# Patient Record
Sex: Male | Born: 1965 | Race: Black or African American | Hispanic: No | Smoking: Current every day smoker
Health system: Southern US, Community
[De-identification: ages and names within clinical notes are randomized; demographics above are authoritative.]

## PROBLEM LIST (undated history)

## (undated) DIAGNOSIS — K859 Acute pancreatitis without necrosis or infection, unspecified: Secondary | ICD-10-CM

## (undated) DIAGNOSIS — E119 Type 2 diabetes mellitus without complications: Secondary | ICD-10-CM

## (undated) DIAGNOSIS — Z9359 Other cystostomy status: Secondary | ICD-10-CM

---

## 2005-12-16 ENCOUNTER — Emergency Department: Payer: Self-pay | Admitting: Emergency Medicine

## 2007-10-31 ENCOUNTER — Emergency Department: Payer: Self-pay | Admitting: Emergency Medicine

## 2007-10-31 ENCOUNTER — Other Ambulatory Visit: Payer: Self-pay

## 2007-11-09 ENCOUNTER — Emergency Department: Payer: Self-pay | Admitting: Emergency Medicine

## 2007-12-18 ENCOUNTER — Emergency Department: Payer: Self-pay | Admitting: Emergency Medicine

## 2007-12-19 ENCOUNTER — Other Ambulatory Visit: Payer: Self-pay

## 2008-01-22 ENCOUNTER — Emergency Department: Payer: Self-pay | Admitting: Emergency Medicine

## 2008-01-23 ENCOUNTER — Emergency Department: Payer: Self-pay | Admitting: Emergency Medicine

## 2009-04-14 ENCOUNTER — Emergency Department: Payer: Self-pay | Admitting: Emergency Medicine

## 2009-06-15 ENCOUNTER — Emergency Department: Payer: Self-pay | Admitting: Unknown Physician Specialty

## 2010-02-17 ENCOUNTER — Emergency Department: Payer: Self-pay | Admitting: Emergency Medicine

## 2012-02-19 ENCOUNTER — Emergency Department: Payer: Self-pay | Admitting: *Deleted

## 2012-04-29 ENCOUNTER — Emergency Department: Payer: Self-pay | Admitting: Emergency Medicine

## 2012-04-29 LAB — CBC
HCT: 41.3 % (ref 40.0–52.0)
HGB: 13.6 g/dL (ref 13.0–18.0)
MCH: 30.6 pg (ref 26.0–34.0)
Platelet: 230 10*3/uL (ref 150–440)
RBC: 4.45 10*6/uL (ref 4.40–5.90)
RDW: 13.8 % (ref 11.5–14.5)
WBC: 6.1 10*3/uL (ref 3.8–10.6)

## 2012-04-29 LAB — COMPREHENSIVE METABOLIC PANEL
Albumin: 3.5 g/dL (ref 3.4–5.0)
Alkaline Phosphatase: 161 U/L — ABNORMAL HIGH (ref 50–136)
Anion Gap: 6 — ABNORMAL LOW (ref 7–16)
BUN: 6 mg/dL — ABNORMAL LOW (ref 7–18)
Bilirubin,Total: 0.3 mg/dL (ref 0.2–1.0)
Creatinine: 0.85 mg/dL (ref 0.60–1.30)
EGFR (African American): >60
EGFR (Non-African Amer.): >60
Glucose: 331 mg/dL — ABNORMAL HIGH (ref 65–99)
SGOT(AST): 80 U/L — ABNORMAL HIGH (ref 15–37)
Sodium: 142 mmol/L (ref 136–145)

## 2012-04-29 LAB — URINALYSIS, COMPLETE
Bacteria: NONE SEEN
Ketone: NEGATIVE
Nitrite: NEGATIVE
Protein: NEGATIVE
RBC,UR: <1 /HPF (ref 0–5)
Specific Gravity: 1.005 (ref 1.003–1.030)
Squamous Epithelial: NONE SEEN

## 2012-05-14 ENCOUNTER — Emergency Department: Payer: Self-pay | Admitting: Emergency Medicine

## 2012-05-25 ENCOUNTER — Emergency Department: Payer: Self-pay | Admitting: *Deleted

## 2012-06-02 ENCOUNTER — Emergency Department: Payer: Self-pay | Admitting: Emergency Medicine

## 2012-06-12 ENCOUNTER — Emergency Department: Payer: Self-pay | Admitting: Emergency Medicine

## 2012-06-12 LAB — URINALYSIS, COMPLETE
Bilirubin,UR: NEGATIVE
Glucose,UR: >=500 mg/dL (ref 0–75)
Nitrite: POSITIVE
Ph: 6 (ref 4.5–8.0)
RBC,UR: 2 /HPF (ref 0–5)
Specific Gravity: 1.025 (ref 1.003–1.030)
Squamous Epithelial: NONE SEEN

## 2012-06-12 LAB — CBC
HGB: 14.9 g/dL (ref 13.0–18.0)
MCV: 94 fL (ref 80–100)
Platelet: 244 10*3/uL (ref 150–440)
RBC: 4.89 10*6/uL (ref 4.40–5.90)
RDW: 14.1 % (ref 11.5–14.5)

## 2012-06-12 LAB — BASIC METABOLIC PANEL
Anion Gap: 8 (ref 7–16)
BUN: 13 mg/dL (ref 7–18)
Calcium, Total: 9.6 mg/dL (ref 8.5–10.1)
Co2: 27 mmol/L (ref 21–32)
Creatinine: 0.9 mg/dL (ref 0.60–1.30)
Potassium: 3.6 mmol/L (ref 3.5–5.1)
Sodium: 131 mmol/L — ABNORMAL LOW (ref 136–145)

## 2012-08-18 ENCOUNTER — Emergency Department: Payer: Self-pay | Admitting: Emergency Medicine

## 2012-08-18 LAB — COMPREHENSIVE METABOLIC PANEL
Albumin: 3.4 g/dL (ref 3.4–5.0)
Anion Gap: 16 (ref 7–16)
BUN: 11 mg/dL (ref 7–18)
Calcium, Total: 10.3 mg/dL — ABNORMAL HIGH (ref 8.5–10.1)
Co2: 20 mmol/L — ABNORMAL LOW (ref 21–32)
EGFR (African American): >60
EGFR (Non-African Amer.): >60
Glucose: 270 mg/dL — ABNORMAL HIGH (ref 65–99)
Potassium: 4.1 mmol/L (ref 3.5–5.1)
SGOT(AST): 29 U/L (ref 15–37)
SGPT (ALT): 44 U/L (ref 12–78)
Total Protein: 8.4 g/dL — ABNORMAL HIGH (ref 6.4–8.2)

## 2012-08-18 LAB — LIPASE, BLOOD: Lipase: 45 U/L — ABNORMAL LOW (ref 73–393)

## 2012-08-18 LAB — CBC
HCT: 48.9 % (ref 40.0–52.0)
HGB: 16.4 g/dL (ref 13.0–18.0)
MCH: 31 pg (ref 26.0–34.0)
MCHC: 33.5 g/dL (ref 32.0–36.0)
WBC: 15.9 10*3/uL — ABNORMAL HIGH (ref 3.8–10.6)

## 2012-08-18 LAB — URINALYSIS, COMPLETE
Bilirubin,UR: NEGATIVE
Glucose,UR: >=500 mg/dL (ref 0–75)
Nitrite: POSITIVE
Protein: 100
Specific Gravity: 1.028 (ref 1.003–1.030)
Squamous Epithelial: NONE SEEN
WBC UR: 13 /HPF (ref 0–5)

## 2012-08-21 LAB — URINE CULTURE

## 2012-08-24 ENCOUNTER — Emergency Department: Payer: Self-pay | Admitting: *Deleted

## 2012-08-24 LAB — URINALYSIS, COMPLETE
Glucose,UR: >=500 mg/dL (ref 0–75)
Nitrite: POSITIVE
Ph: 6 (ref 4.5–8.0)
Specific Gravity: 1.026 (ref 1.003–1.030)

## 2012-08-24 LAB — CBC WITH DIFFERENTIAL/PLATELET
Basophil #: 0.1 10*3/uL (ref 0.0–0.1)
Eosinophil %: 0.8 %
HCT: 44.2 % (ref 40.0–52.0)
HGB: 14.6 g/dL (ref 13.0–18.0)
Lymphocyte #: 1.7 10*3/uL (ref 1.0–3.6)
Lymphocyte %: 15.4 %
MCV: 91 fL (ref 80–100)
Monocyte %: 7.7 %
Neutrophil #: 8.5 10*3/uL — ABNORMAL HIGH (ref 1.4–6.5)
Platelet: 363 10*3/uL (ref 150–440)
RDW: 13.8 % (ref 11.5–14.5)
WBC: 11.2 10*3/uL — ABNORMAL HIGH (ref 3.8–10.6)

## 2012-08-24 LAB — BASIC METABOLIC PANEL
Anion Gap: 12 (ref 7–16)
BUN: 8 mg/dL (ref 7–18)
Calcium, Total: 9.4 mg/dL (ref 8.5–10.1)
Chloride: 100 mmol/L (ref 98–107)
Co2: 23 mmol/L (ref 21–32)
Creatinine: 0.6 mg/dL (ref 0.60–1.30)
EGFR (African American): >60
EGFR (Non-African Amer.): >60
Glucose: 267 mg/dL — ABNORMAL HIGH (ref 65–99)
Osmolality: 278 (ref 275–301)
Potassium: 3.7 mmol/L (ref 3.5–5.1)
Sodium: 135 mmol/L — ABNORMAL LOW (ref 136–145)

## 2012-09-13 ENCOUNTER — Emergency Department: Payer: Self-pay

## 2012-09-18 ENCOUNTER — Emergency Department: Payer: Self-pay | Admitting: Emergency Medicine

## 2012-09-19 LAB — CBC
MCHC: 34 g/dL (ref 32.0–36.0)
MCV: 94 fL (ref 80–100)
Platelet: 275 10*3/uL (ref 150–440)
RBC: 4.2 10*6/uL — ABNORMAL LOW (ref 4.40–5.90)
WBC: 6 10*3/uL (ref 3.8–10.6)

## 2012-09-19 LAB — COMPREHENSIVE METABOLIC PANEL
Alkaline Phosphatase: 202 U/L — ABNORMAL HIGH (ref 50–136)
BUN: 9 mg/dL (ref 7–18)
Bilirubin,Total: 0.1 mg/dL — ABNORMAL LOW (ref 0.2–1.0)
Co2: 24 mmol/L (ref 21–32)
Creatinine: 0.92 mg/dL (ref 0.60–1.30)
EGFR (Non-African Amer.): >60
Glucose: 620 mg/dL (ref 65–99)
SGPT (ALT): 62 U/L (ref 12–78)
Total Protein: 6.9 g/dL (ref 6.4–8.2)

## 2012-10-05 ENCOUNTER — Emergency Department: Payer: Self-pay | Admitting: Emergency Medicine

## 2012-10-28 ENCOUNTER — Emergency Department: Payer: Self-pay | Admitting: Emergency Medicine

## 2012-10-28 LAB — URINALYSIS, COMPLETE
Bilirubin,UR: NEGATIVE
Glucose,UR: >=500 mg/dL (ref 0–75)
Ph: 6 (ref 4.5–8.0)
Protein: 30
RBC,UR: 32 /HPF (ref 0–5)
Squamous Epithelial: 1

## 2012-10-30 LAB — URINE CULTURE

## 2012-11-01 LAB — URINALYSIS, COMPLETE
Bilirubin,UR: NEGATIVE
Glucose,UR: >=500 mg/dL (ref 0–75)
Leukocyte Esterase: NEGATIVE
Protein: 30
Specific Gravity: 1.021 (ref 1.003–1.030)
Squamous Epithelial: NONE SEEN

## 2012-11-02 ENCOUNTER — Inpatient Hospital Stay: Payer: Self-pay | Admitting: Internal Medicine

## 2012-11-02 LAB — COMPREHENSIVE METABOLIC PANEL
Anion Gap: 23 — ABNORMAL HIGH (ref 7–16)
BUN: 21 mg/dL — ABNORMAL HIGH (ref 7–18)
Calcium, Total: 9.9 mg/dL (ref 8.5–10.1)
Chloride: 96 mmol/L — ABNORMAL LOW (ref 98–107)
EGFR (African American): >60
Glucose: 315 mg/dL — ABNORMAL HIGH (ref 65–99)
Osmolality: 272 (ref 275–301)
Potassium: 4.8 mmol/L (ref 3.5–5.1)
SGOT(AST): 31 U/L (ref 15–37)
SGPT (ALT): 66 U/L (ref 12–78)

## 2012-11-02 LAB — CBC WITH DIFFERENTIAL/PLATELET
Basophil %: 0.8 %
Eosinophil %: 0 %
HGB: 13 g/dL (ref 13.0–18.0)
Lymphocyte #: 1.3 10*3/uL (ref 1.0–3.6)
MCH: 30.6 pg (ref 26.0–34.0)
MCV: 92 fL (ref 80–100)
Monocyte #: 1.4 x10 3/mm — ABNORMAL HIGH (ref 0.2–1.0)
Monocyte %: 4.4 %
Platelet: 271 10*3/uL (ref 150–440)
RBC: 4.23 10*6/uL — ABNORMAL LOW (ref 4.40–5.90)
RDW: 14.5 % (ref 11.5–14.5)
WBC: 32.5 10*3/uL — ABNORMAL HIGH (ref 3.8–10.6)

## 2012-11-02 LAB — BASIC METABOLIC PANEL
Anion Gap: 16 (ref 7–16)
Anion Gap: 13 (ref 7–16)
BUN: 11 mg/dL (ref 7–18)
BUN: 10 mg/dL (ref 7–18)
BUN: 10 mg/dL (ref 7–18)
Calcium, Total: 9 mg/dL (ref 8.5–10.1)
Chloride: 102 mmol/L (ref 98–107)
Chloride: 103 mmol/L (ref 98–107)
Chloride: 104 mmol/L (ref 98–107)
Chloride: 102 mmol/L (ref 98–107)
Co2: 11 mmol/L — ABNORMAL LOW (ref 21–32)
Co2: 12 mmol/L — ABNORMAL LOW (ref 21–32)
Creatinine: 0.62 mg/dL (ref 0.60–1.30)
Creatinine: 0.7 mg/dL (ref 0.60–1.30)
Creatinine: 0.77 mg/dL (ref 0.60–1.30)
Creatinine: 0.74 mg/dL (ref 0.60–1.30)
EGFR (African American): >60
EGFR (Non-African Amer.): >60
Glucose: 225 mg/dL — ABNORMAL HIGH (ref 65–99)
Glucose: 317 mg/dL — ABNORMAL HIGH (ref 65–99)
Osmolality: 267 (ref 275–301)
Osmolality: 266 (ref 275–301)
Osmolality: 276 (ref 275–301)
Osmolality: 274 (ref 275–301)
Potassium: 4.2 mmol/L (ref 3.5–5.1)
Potassium: 4.3 mmol/L (ref 3.5–5.1)
Potassium: 4.5 mmol/L (ref 3.5–5.1)
Sodium: 132 mmol/L — ABNORMAL LOW (ref 136–145)
Sodium: 131 mmol/L — ABNORMAL LOW (ref 136–145)

## 2012-11-02 LAB — HEPATIC FUNCTION PANEL A (ARMC)
Albumin: 3.1 g/dL — ABNORMAL LOW (ref 3.4–5.0)
Alkaline Phosphatase: 292 U/L — ABNORMAL HIGH (ref 50–136)
Bilirubin, Direct: 0.1 mg/dL (ref 0.00–0.20)
Bilirubin,Total: 0.6 mg/dL (ref 0.2–1.0)
SGPT (ALT): 56 U/L (ref 12–78)

## 2012-11-02 LAB — CREATININE, URINE, RANDOM: Creatinine, Urine Random: 19.2 mg/dL — ABNORMAL LOW (ref 30.0–125.0)

## 2012-11-02 LAB — CK TOTAL AND CKMB (NOT AT ARMC): CK-MB: 3.1 ng/mL (ref 0.5–3.6)

## 2012-11-02 LAB — CBC
HCT: 49 % (ref 40.0–52.0)
HGB: 15.6 g/dL (ref 13.0–18.0)
MCH: 29.8 pg (ref 26.0–34.0)
MCHC: 31.8 g/dL — ABNORMAL LOW (ref 32.0–36.0)
MCV: 94 fL (ref 80–100)
Platelet: 337 10*3/uL (ref 150–440)
RDW: 14.2 % (ref 11.5–14.5)
WBC: 34.1 10*3/uL — ABNORMAL HIGH (ref 3.8–10.6)

## 2012-11-02 LAB — OSMOLALITY, URINE: Osmolality: 582 mOsm/kg

## 2012-11-03 LAB — COMPREHENSIVE METABOLIC PANEL
Albumin: 2.7 g/dL — ABNORMAL LOW (ref 3.4–5.0)
Alkaline Phosphatase: 261 U/L — ABNORMAL HIGH (ref 50–136)
Anion Gap: 12 (ref 7–16)
BUN: 8 mg/dL (ref 7–18)
Bilirubin,Total: 0.3 mg/dL (ref 0.2–1.0)
Calcium, Total: 8.9 mg/dL (ref 8.5–10.1)
Creatinine: 0.74 mg/dL (ref 0.60–1.30)
EGFR (African American): >60
EGFR (Non-African Amer.): >60
Glucose: 108 mg/dL — ABNORMAL HIGH (ref 65–99)
Osmolality: 273 (ref 275–301)
Potassium: 3.7 mmol/L (ref 3.5–5.1)
SGOT(AST): 24 U/L (ref 15–37)
Total Protein: 7.6 g/dL (ref 6.4–8.2)

## 2012-11-03 LAB — BASIC METABOLIC PANEL
BUN: 9 mg/dL (ref 7–18)
Calcium, Total: 9 mg/dL (ref 8.5–10.1)
Chloride: 102 mmol/L (ref 98–107)
EGFR (African American): >60
EGFR (Non-African Amer.): >60
Glucose: 236 mg/dL — ABNORMAL HIGH (ref 65–99)
Osmolality: 267 (ref 275–301)
Potassium: 4.2 mmol/L (ref 3.5–5.1)
Sodium: 130 mmol/L — ABNORMAL LOW (ref 136–145)

## 2012-11-03 LAB — CBC WITH DIFFERENTIAL/PLATELET
Basophil %: 0.7 %
Eosinophil #: 0 10*3/uL (ref 0.0–0.7)
Eosinophil %: 0 %
HCT: 39.8 % — ABNORMAL LOW (ref 40.0–52.0)
HGB: 13.1 g/dL (ref 13.0–18.0)
Lymphocyte #: 1.1 10*3/uL (ref 1.0–3.6)
MCH: 30 pg (ref 26.0–34.0)
MCHC: 33 g/dL (ref 32.0–36.0)
MCV: 91 fL (ref 80–100)
Monocyte #: 1.3 x10 3/mm — ABNORMAL HIGH (ref 0.2–1.0)
Neutrophil #: 24.1 10*3/uL — ABNORMAL HIGH (ref 1.4–6.5)
Neutrophil %: 90.3 %
WBC: 26.7 10*3/uL — ABNORMAL HIGH (ref 3.8–10.6)

## 2012-11-04 LAB — CBC WITH DIFFERENTIAL/PLATELET
Basophil #: 0.1 10*3/uL (ref 0.0–0.1)
Eosinophil #: 0 10*3/uL (ref 0.0–0.7)
Eosinophil %: 0.2 %
HCT: 37.9 % — ABNORMAL LOW (ref 40.0–52.0)
Lymphocyte %: 7.6 %
MCHC: 32.4 g/dL (ref 32.0–36.0)
Monocyte %: 7.3 %
Neutrophil #: 14.2 10*3/uL — ABNORMAL HIGH (ref 1.4–6.5)
Neutrophil %: 84.4 %
Platelet: 266 10*3/uL (ref 150–440)
RBC: 4.21 10*6/uL — ABNORMAL LOW (ref 4.40–5.90)
RDW: 14.6 % — ABNORMAL HIGH (ref 11.5–14.5)
WBC: 16.8 10*3/uL — ABNORMAL HIGH (ref 3.8–10.6)

## 2012-11-04 LAB — BASIC METABOLIC PANEL
Calcium, Total: 8.7 mg/dL (ref 8.5–10.1)
Chloride: 101 mmol/L (ref 98–107)
Co2: 24 mmol/L (ref 21–32)
Creatinine: 0.44 mg/dL — ABNORMAL LOW (ref 0.60–1.30)
EGFR (Non-African Amer.): >60
Potassium: 4.4 mmol/L (ref 3.5–5.1)
Sodium: 134 mmol/L — ABNORMAL LOW (ref 136–145)

## 2012-11-06 LAB — CULTURE, BLOOD (SINGLE)

## 2012-11-07 LAB — CULTURE, BLOOD (SINGLE)

## 2012-11-28 ENCOUNTER — Emergency Department: Payer: Self-pay | Admitting: Emergency Medicine

## 2012-12-17 ENCOUNTER — Emergency Department: Payer: Self-pay | Admitting: Emergency Medicine

## 2012-12-17 LAB — CBC WITH DIFFERENTIAL/PLATELET
Basophil %: 0.5 %
Eosinophil #: 0.2 10*3/uL (ref 0.0–0.7)
HCT: 40.9 % (ref 40.0–52.0)
HGB: 13.4 g/dL (ref 13.0–18.0)
Lymphocyte %: 15.9 %
Monocyte #: 0.5 x10 3/mm (ref 0.2–1.0)
Monocyte %: 5.2 %
Neutrophil %: 76.8 %
Platelet: 303 10*3/uL (ref 150–440)
RBC: 4.54 10*6/uL (ref 4.40–5.90)
WBC: 10.4 10*3/uL (ref 3.8–10.6)

## 2012-12-17 LAB — URINALYSIS, COMPLETE
Bilirubin,UR: NEGATIVE
Glucose,UR: >=500 mg/dL (ref 0–75)
Nitrite: POSITIVE
Ph: 7 (ref 4.5–8.0)
Specific Gravity: 1.031 (ref 1.003–1.030)
Squamous Epithelial: NONE SEEN
WBC UR: 320 /HPF (ref 0–5)

## 2012-12-17 LAB — COMPREHENSIVE METABOLIC PANEL
Anion Gap: 7 (ref 7–16)
BUN: 8 mg/dL (ref 7–18)
Bilirubin,Total: 0.3 mg/dL (ref 0.2–1.0)
Chloride: 100 mmol/L (ref 98–107)
Creatinine: 0.55 mg/dL — ABNORMAL LOW (ref 0.60–1.30)
EGFR (African American): >60
Glucose: 326 mg/dL — ABNORMAL HIGH (ref 65–99)
Osmolality: 279 (ref 275–301)
Potassium: 3.9 mmol/L (ref 3.5–5.1)
SGPT (ALT): 25 U/L (ref 12–78)
Sodium: 134 mmol/L — ABNORMAL LOW (ref 136–145)
Total Protein: 8 g/dL (ref 6.4–8.2)

## 2012-12-17 LAB — SEDIMENTATION RATE: Erythrocyte Sed Rate: 54 mm/hr — ABNORMAL HIGH (ref 0–15)

## 2012-12-30 ENCOUNTER — Ambulatory Visit: Payer: Self-pay | Admitting: Urology

## 2012-12-30 ENCOUNTER — Inpatient Hospital Stay: Payer: Self-pay | Admitting: Internal Medicine

## 2012-12-30 LAB — COMPREHENSIVE METABOLIC PANEL
Calcium, Total: 9.3 mg/dL (ref 8.5–10.1)
Co2: 27 mmol/L (ref 21–32)
Creatinine: 0.62 mg/dL (ref 0.60–1.30)
Glucose: 381 mg/dL — ABNORMAL HIGH (ref 65–99)
Osmolality: 282 (ref 275–301)
Potassium: 3.3 mmol/L — ABNORMAL LOW (ref 3.5–5.1)
SGPT (ALT): 37 U/L (ref 12–78)
Sodium: 133 mmol/L — ABNORMAL LOW (ref 136–145)

## 2012-12-30 LAB — CBC WITH DIFFERENTIAL/PLATELET
Eosinophil #: 0.1 10*3/uL (ref 0.0–0.7)
Eosinophil %: 0.6 %
HCT: 39.9 % — ABNORMAL LOW (ref 40.0–52.0)
HGB: 13.1 g/dL (ref 13.0–18.0)
MCV: 90 fL (ref 80–100)
Monocyte %: 5.5 %
Neutrophil #: 12.4 10*3/uL — ABNORMAL HIGH (ref 1.4–6.5)
Neutrophil %: 81.9 %

## 2012-12-30 LAB — URINALYSIS, COMPLETE
Bacteria: NONE SEEN
Bilirubin,UR: NEGATIVE
Nitrite: NEGATIVE
Transitional Epi: <1
WBC UR: 171 /HPF (ref 0–5)

## 2012-12-31 LAB — CBC WITH DIFFERENTIAL/PLATELET
Basophil #: 0.1 10*3/uL (ref 0.0–0.1)
Eosinophil #: 0.1 10*3/uL (ref 0.0–0.7)
HCT: 35.4 % — ABNORMAL LOW (ref 40.0–52.0)
Lymphocyte %: 8.8 %
MCHC: 32.4 g/dL (ref 32.0–36.0)
Monocyte #: 1 x10 3/mm (ref 0.2–1.0)
Neutrophil #: 13.2 10*3/uL — ABNORMAL HIGH (ref 1.4–6.5)
Neutrophil %: 83.9 %
Platelet: 317 10*3/uL (ref 150–440)
WBC: 15.7 10*3/uL — ABNORMAL HIGH (ref 3.8–10.6)

## 2012-12-31 LAB — COMPREHENSIVE METABOLIC PANEL
Albumin: 2.4 g/dL — ABNORMAL LOW (ref 3.4–5.0)
Chloride: 101 mmol/L (ref 98–107)
Co2: 29 mmol/L (ref 21–32)
Glucose: 162 mg/dL — ABNORMAL HIGH (ref 65–99)
Osmolality: 274 (ref 275–301)
Potassium: 3.7 mmol/L (ref 3.5–5.1)
SGOT(AST): 36 U/L (ref 15–37)
Sodium: 136 mmol/L (ref 136–145)
Total Protein: 7.4 g/dL (ref 6.4–8.2)

## 2012-12-31 LAB — MAGNESIUM: Magnesium: 1.9 mg/dL

## 2013-01-01 LAB — CBC WITH DIFFERENTIAL/PLATELET
Basophil #: 0.1 10*3/uL (ref 0.0–0.1)
Basophil %: 0.6 %
Eosinophil #: 0.1 10*3/uL (ref 0.0–0.7)
Eosinophil %: 1.1 %
Lymphocyte #: 1.8 10*3/uL (ref 1.0–3.6)
MCV: 90 fL (ref 80–100)
Monocyte #: 0.6 x10 3/mm (ref 0.2–1.0)
Monocyte %: 4.8 %
Neutrophil #: 9 10*3/uL — ABNORMAL HIGH (ref 1.4–6.5)
Neutrophil %: 77.6 %
Platelet: 318 10*3/uL (ref 150–440)
WBC: 11.6 10*3/uL — ABNORMAL HIGH (ref 3.8–10.6)

## 2013-01-01 LAB — BASIC METABOLIC PANEL
Calcium, Total: 8.6 mg/dL (ref 8.5–10.1)
Creatinine: 0.54 mg/dL — ABNORMAL LOW (ref 0.60–1.30)
EGFR (African American): >60
EGFR (Non-African Amer.): >60
Glucose: 99 mg/dL (ref 65–99)
Potassium: 3.2 mmol/L — ABNORMAL LOW (ref 3.5–5.1)

## 2013-01-02 LAB — CBC WITH DIFFERENTIAL/PLATELET
Basophil %: 0.9 %
Eosinophil #: 0.2 10*3/uL (ref 0.0–0.7)
Eosinophil %: 1.8 %
Lymphocyte #: 1.6 10*3/uL (ref 1.0–3.6)
Lymphocyte %: 13.2 %
MCHC: 33 g/dL (ref 32.0–36.0)
MCV: 91 fL (ref 80–100)
Monocyte #: 0.5 x10 3/mm (ref 0.2–1.0)
Monocyte %: 4.5 %
Neutrophil %: 79.6 %

## 2013-01-03 LAB — CBC WITH DIFFERENTIAL/PLATELET
Basophil %: 1.1 %
Eosinophil #: 0.2 10*3/uL (ref 0.0–0.7)
Eosinophil %: 1.9 %
Lymphocyte #: 1.9 10*3/uL (ref 1.0–3.6)
Monocyte %: 6.9 %
Platelet: 387 10*3/uL (ref 150–440)
RBC: 3.74 10*6/uL — ABNORMAL LOW (ref 4.40–5.90)

## 2013-01-04 LAB — WOUND AEROBIC CULTURE

## 2013-01-05 LAB — CULTURE, BLOOD (SINGLE)

## 2013-01-14 ENCOUNTER — Emergency Department: Payer: Self-pay | Admitting: Emergency Medicine

## 2013-01-22 ENCOUNTER — Emergency Department: Payer: Self-pay | Admitting: Emergency Medicine

## 2013-01-22 LAB — CBC WITH DIFFERENTIAL/PLATELET
Basophil %: 0.9 %
Eosinophil %: 2.2 %
HCT: 41.2 % (ref 40.0–52.0)
HGB: 13.2 g/dL (ref 13.0–18.0)
Lymphocyte %: 30.9 %
MCH: 29.4 pg (ref 26.0–34.0)
MCHC: 32 g/dL (ref 32.0–36.0)
Monocyte #: 0.4 x10 3/mm (ref 0.2–1.0)
Neutrophil #: 3.9 10*3/uL (ref 1.4–6.5)
Neutrophil %: 59.8 %
Platelet: 288 10*3/uL (ref 150–440)
RBC: 4.49 10*6/uL (ref 4.40–5.90)
WBC: 6.5 10*3/uL (ref 3.8–10.6)

## 2013-01-22 LAB — COMPREHENSIVE METABOLIC PANEL
Albumin: 3 g/dL — ABNORMAL LOW (ref 3.4–5.0)
Alkaline Phosphatase: 337 U/L — ABNORMAL HIGH (ref 50–136)
Anion Gap: 7 (ref 7–16)
Calcium, Total: 9.2 mg/dL (ref 8.5–10.1)
Chloride: 101 mmol/L (ref 98–107)
Creatinine: 0.87 mg/dL (ref 0.60–1.30)
EGFR (African American): >60
EGFR (Non-African Amer.): >60
Glucose: 638 mg/dL (ref 65–99)
Potassium: 4 mmol/L (ref 3.5–5.1)
SGOT(AST): 75 U/L — ABNORMAL HIGH (ref 15–37)
SGPT (ALT): 95 U/L — ABNORMAL HIGH (ref 12–78)
Sodium: 132 mmol/L — ABNORMAL LOW (ref 136–145)
Total Protein: 7.8 g/dL (ref 6.4–8.2)

## 2013-01-26 LAB — WOUND CULTURE

## 2013-02-04 ENCOUNTER — Emergency Department: Payer: Self-pay | Admitting: Internal Medicine

## 2013-02-04 LAB — COMPREHENSIVE METABOLIC PANEL
Albumin: 3.2 g/dL — ABNORMAL LOW (ref 3.4–5.0)
Alkaline Phosphatase: 440 U/L — ABNORMAL HIGH (ref 50–136)
Anion Gap: 12 (ref 7–16)
Bilirubin,Total: 0.2 mg/dL (ref 0.2–1.0)
Co2: 23 mmol/L (ref 21–32)
Creatinine: 0.82 mg/dL (ref 0.60–1.30)
EGFR (African American): >60
EGFR (Non-African Amer.): >60
Potassium: 3.7 mmol/L (ref 3.5–5.1)
SGPT (ALT): 141 U/L — ABNORMAL HIGH (ref 12–78)
Sodium: 131 mmol/L — ABNORMAL LOW (ref 136–145)
Total Protein: 7.8 g/dL (ref 6.4–8.2)

## 2013-02-04 LAB — URINALYSIS, COMPLETE
Bilirubin,UR: NEGATIVE
Ketone: NEGATIVE
Nitrite: POSITIVE
Ph: 7 (ref 4.5–8.0)
Protein: NEGATIVE
RBC,UR: 32 /HPF (ref 0–5)
Specific Gravity: 1.028 (ref 1.003–1.030)
Squamous Epithelial: <1
WBC UR: 81 /HPF (ref 0–5)

## 2013-02-04 LAB — CBC
HCT: 43.4 % (ref 40.0–52.0)
HGB: 13.9 g/dL (ref 13.0–18.0)
MCH: 29.7 pg (ref 26.0–34.0)
MCHC: 32 g/dL (ref 32.0–36.0)
MCV: 93 fL (ref 80–100)
Platelet: 281 10*3/uL (ref 150–440)
WBC: 8.5 10*3/uL (ref 3.8–10.6)

## 2013-02-09 LAB — CULTURE, BLOOD (SINGLE)

## 2013-02-11 LAB — URINE CULTURE

## 2013-02-17 ENCOUNTER — Emergency Department: Payer: Self-pay | Admitting: Emergency Medicine

## 2013-02-17 LAB — CBC
HCT: 48.1 % (ref 40.0–52.0)
MCH: 29.5 pg (ref 26.0–34.0)
MCHC: 32 g/dL (ref 32.0–36.0)
MCV: 92 fL (ref 80–100)
RBC: 5.21 10*6/uL (ref 4.40–5.90)
RDW: 14.5 % (ref 11.5–14.5)
WBC: 6.8 10*3/uL (ref 3.8–10.6)

## 2013-02-17 LAB — URINALYSIS, COMPLETE
Bilirubin,UR: NEGATIVE
Glucose,UR: >=500 mg/dL (ref 0–75)
Ketone: NEGATIVE
Nitrite: NEGATIVE
Ph: 6 (ref 4.5–8.0)
Protein: 30
RBC,UR: 185 /HPF (ref 0–5)
Specific Gravity: 1.028 (ref 1.003–1.030)
Squamous Epithelial: NONE SEEN
WBC UR: 333 /HPF (ref 0–5)

## 2013-02-17 LAB — COMPREHENSIVE METABOLIC PANEL
Albumin: 3.4 g/dL (ref 3.4–5.0)
Alkaline Phosphatase: 464 U/L — ABNORMAL HIGH (ref 50–136)
BUN: 7 mg/dL (ref 7–18)
Bilirubin,Total: 0.3 mg/dL (ref 0.2–1.0)
Chloride: 98 mmol/L (ref 98–107)
Co2: 27 mmol/L (ref 21–32)
Creatinine: 0.85 mg/dL (ref 0.60–1.30)
EGFR (African American): >60
EGFR (Non-African Amer.): >60
Osmolality: 287 (ref 275–301)
Potassium: 3.1 mmol/L — ABNORMAL LOW (ref 3.5–5.1)
SGOT(AST): 44 U/L — ABNORMAL HIGH (ref 15–37)
Sodium: 132 mmol/L — ABNORMAL LOW (ref 136–145)
Total Protein: 8.3 g/dL — ABNORMAL HIGH (ref 6.4–8.2)

## 2013-02-22 LAB — URINE CULTURE

## 2013-03-02 ENCOUNTER — Emergency Department: Payer: Self-pay | Admitting: Unknown Physician Specialty

## 2013-03-02 LAB — URINALYSIS, COMPLETE
Bacteria: NONE SEEN
Nitrite: POSITIVE
Ph: 6 (ref 4.5–8.0)
Protein: 100
RBC,UR: 58 /HPF (ref 0–5)
Squamous Epithelial: NONE SEEN

## 2013-03-04 LAB — URINE CULTURE

## 2013-04-07 ENCOUNTER — Emergency Department: Payer: Self-pay | Admitting: Emergency Medicine

## 2013-04-07 LAB — URINALYSIS, COMPLETE
Glucose,UR: >=500 mg/dL (ref 0–75)
Nitrite: POSITIVE
Protein: 100
RBC,UR: 46 /HPF (ref 0–5)
Specific Gravity: 1.03 (ref 1.003–1.030)
Squamous Epithelial: NONE SEEN
WBC UR: 59 /HPF (ref 0–5)

## 2013-04-14 ENCOUNTER — Emergency Department: Payer: Self-pay | Admitting: Emergency Medicine

## 2013-04-14 LAB — BASIC METABOLIC PANEL
Anion Gap: 9 (ref 7–16)
Co2: 27 mmol/L (ref 21–32)
EGFR (African American): >60
EGFR (Non-African Amer.): >60
Osmolality: 288 (ref 275–301)
Potassium: 3.5 mmol/L (ref 3.5–5.1)

## 2013-04-16 ENCOUNTER — Emergency Department: Payer: Self-pay | Admitting: Emergency Medicine

## 2013-04-17 LAB — COMPREHENSIVE METABOLIC PANEL
Alkaline Phosphatase: 367 U/L — ABNORMAL HIGH (ref 50–136)
Anion Gap: 9 (ref 7–16)
Bilirubin,Total: 0.3 mg/dL (ref 0.2–1.0)
Calcium, Total: 9 mg/dL (ref 8.5–10.1)
Chloride: 95 mmol/L — ABNORMAL LOW (ref 98–107)
Co2: 27 mmol/L (ref 21–32)
EGFR (African American): >60
Glucose: 578 mg/dL (ref 65–99)
SGOT(AST): 70 U/L — ABNORMAL HIGH (ref 15–37)
SGPT (ALT): 115 U/L — ABNORMAL HIGH (ref 12–78)
Total Protein: 7.3 g/dL (ref 6.4–8.2)

## 2013-04-17 LAB — CBC
HGB: 13.4 g/dL (ref 13.0–18.0)
MCH: 29.5 pg (ref 26.0–34.0)
MCHC: 33 g/dL (ref 32.0–36.0)
MCV: 89 fL (ref 80–100)
Platelet: 289 10*3/uL (ref 150–440)
RBC: 4.55 10*6/uL (ref 4.40–5.90)
RDW: 14.6 % — ABNORMAL HIGH (ref 11.5–14.5)
WBC: 8.2 10*3/uL (ref 3.8–10.6)

## 2013-05-03 ENCOUNTER — Emergency Department: Payer: Self-pay | Admitting: Emergency Medicine

## 2013-05-03 LAB — URINALYSIS, COMPLETE
Bacteria: NONE SEEN
Bilirubin,UR: NEGATIVE
Ketone: NEGATIVE
Nitrite: NEGATIVE
RBC,UR: 10 /HPF (ref 0–5)
Specific Gravity: 1.017 (ref 1.003–1.030)
Squamous Epithelial: NONE SEEN
WBC UR: 1 /HPF (ref 0–5)

## 2013-10-30 ENCOUNTER — Emergency Department: Payer: Self-pay | Admitting: Emergency Medicine

## 2013-10-30 LAB — COMPREHENSIVE METABOLIC PANEL
Albumin: 3.6 g/dL (ref 3.4–5.0)
Calcium, Total: 10.1 mg/dL (ref 8.5–10.1)
Co2: 32 mmol/L (ref 21–32)
Creatinine: 0.98 mg/dL (ref 0.60–1.30)
EGFR (African American): >60
EGFR (Non-African Amer.): >60
Glucose: 546 mg/dL (ref 65–99)
Osmolality: 283 (ref 275–301)
Potassium: 4.5 mmol/L (ref 3.5–5.1)
Sodium: 129 mmol/L — ABNORMAL LOW (ref 136–145)

## 2013-10-30 LAB — URINALYSIS, COMPLETE
Bilirubin,UR: NEGATIVE
Ketone: NEGATIVE
Ph: 6 (ref 4.5–8.0)
RBC,UR: 3 /HPF (ref 0–5)
Specific Gravity: 1.021 (ref 1.003–1.030)
Squamous Epithelial: NONE SEEN
WBC UR: 11 /HPF (ref 0–5)

## 2013-10-30 LAB — CBC
HCT: 44.1 % (ref 40.0–52.0)
HGB: 14.7 g/dL (ref 13.0–18.0)
MCHC: 33.3 g/dL (ref 32.0–36.0)
MCV: 92 fL (ref 80–100)
RBC: 4.81 10*6/uL (ref 4.40–5.90)

## 2013-10-30 LAB — BASIC METABOLIC PANEL
BUN: 10 mg/dL (ref 7–18)
Chloride: 104 mmol/L (ref 98–107)
EGFR (African American): >60
EGFR (Non-African Amer.): >60
Osmolality: 272 (ref 275–301)
Sodium: 139 mmol/L (ref 136–145)

## 2014-08-06 IMAGING — US US PELVIS LIMITED
1 series · 13 of 25 positions shown · non-contrast
Comparison: none

REASON FOR EXAM: characterization of abscess scrotum
COMMENTS:

[Series 1: us pelvis limited · 0.08mm/px · 13 of 113 slices shown]
[im 1/113]
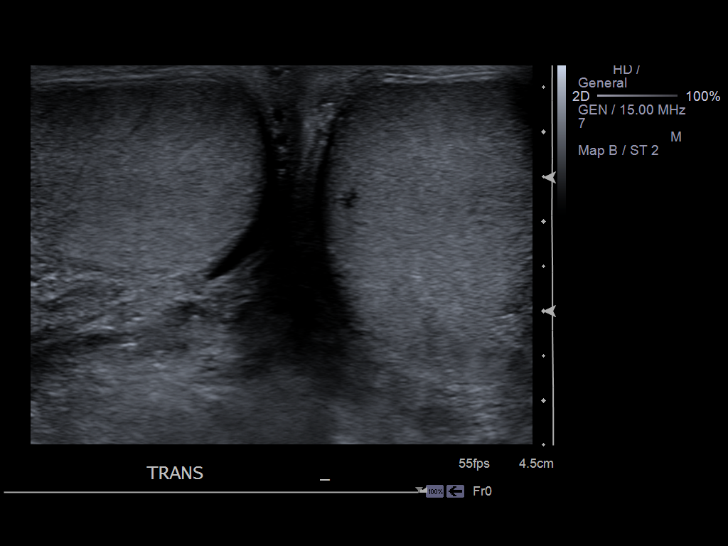
[im 10/113]
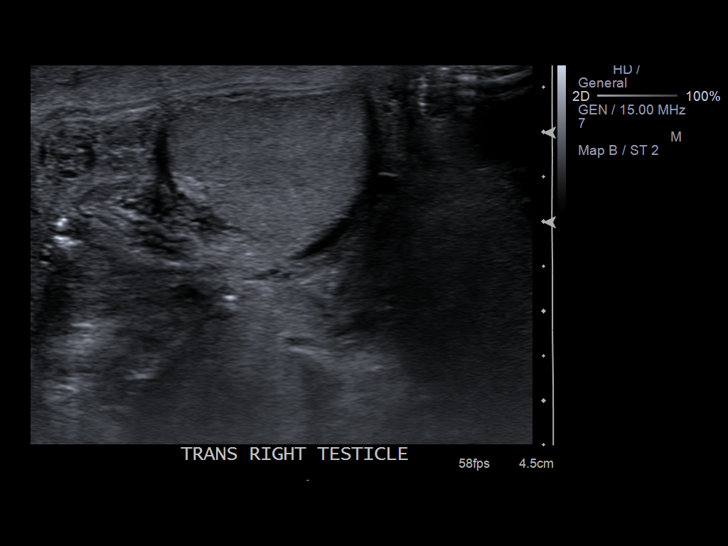
[im 19/113]
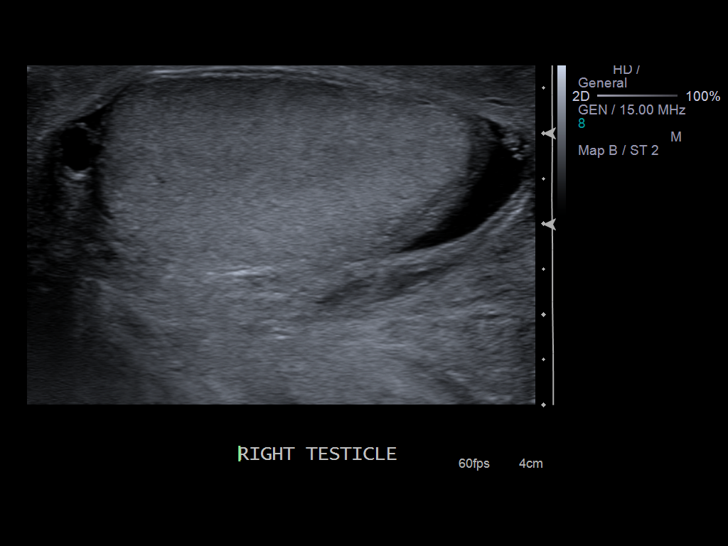
[im 29/113]
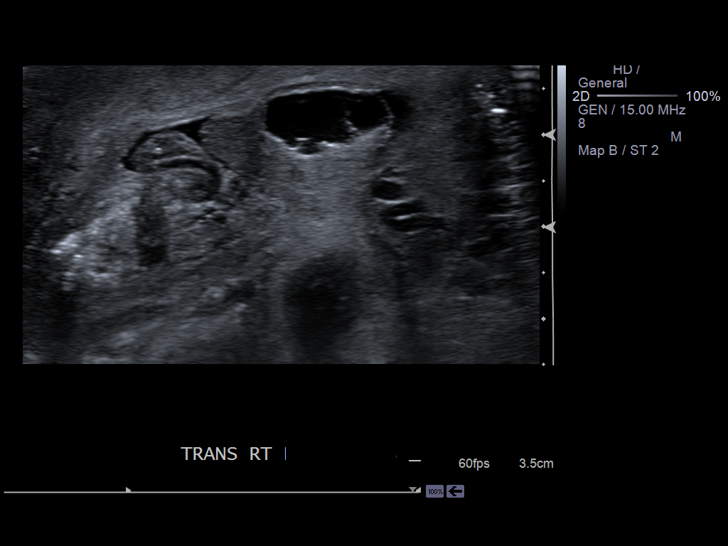
[im 38/113]
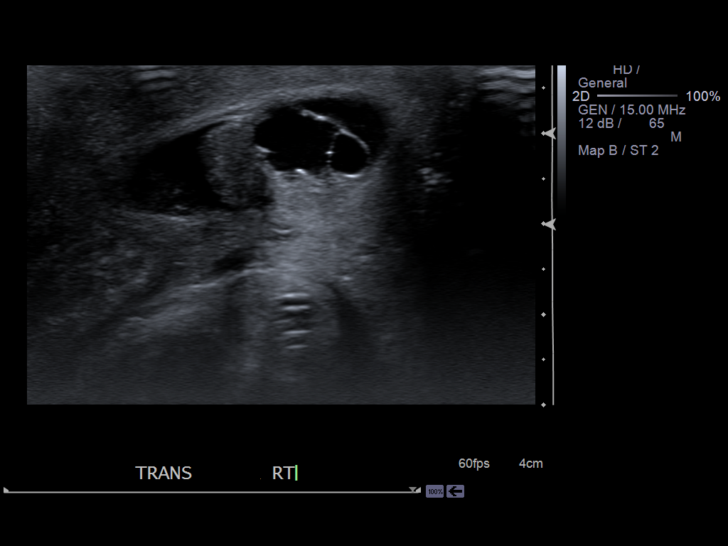
[im 47/113]
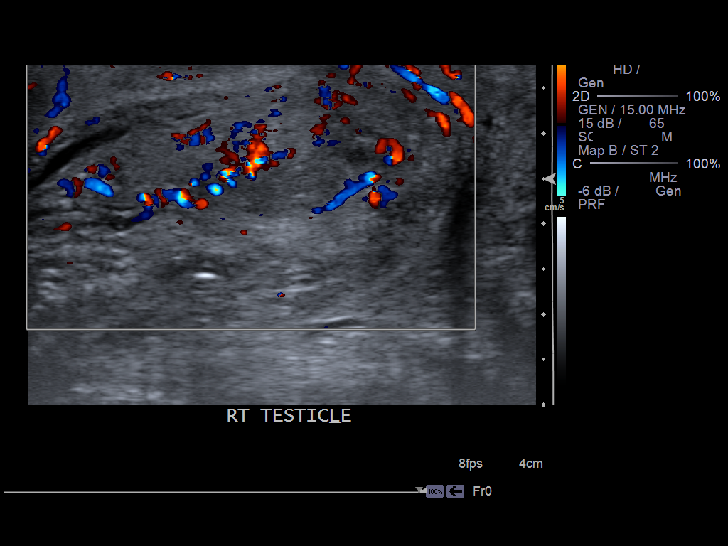
[im 57/113]
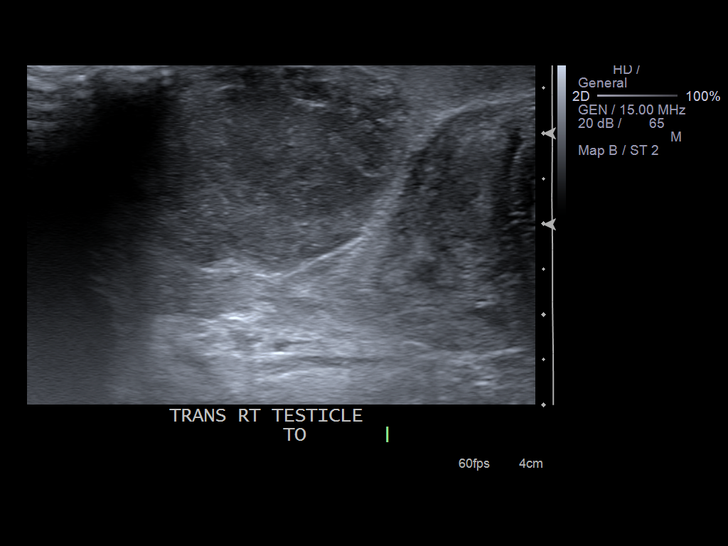
[im 66/113]
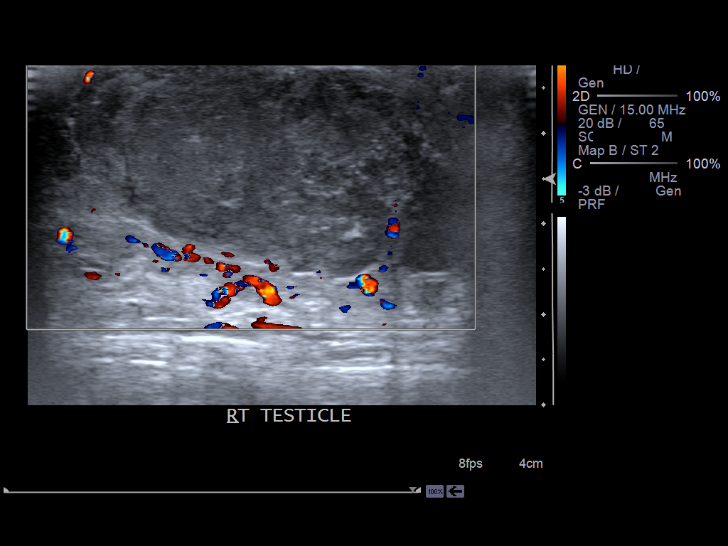
[im 75/113]
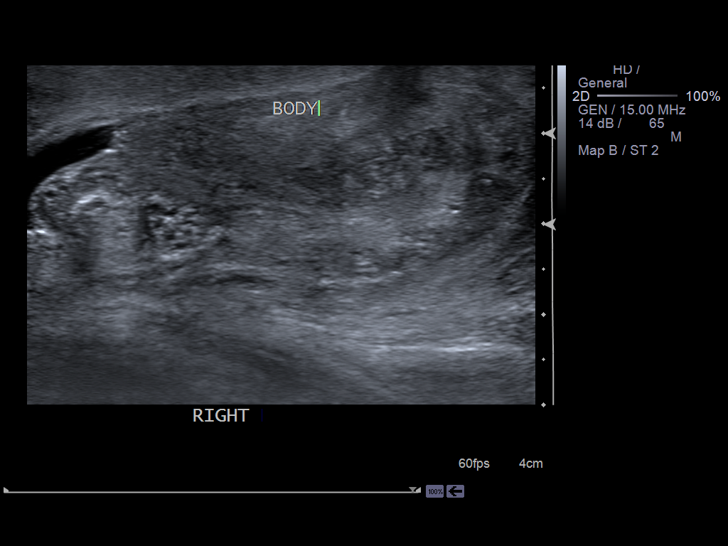
[im 85/113]
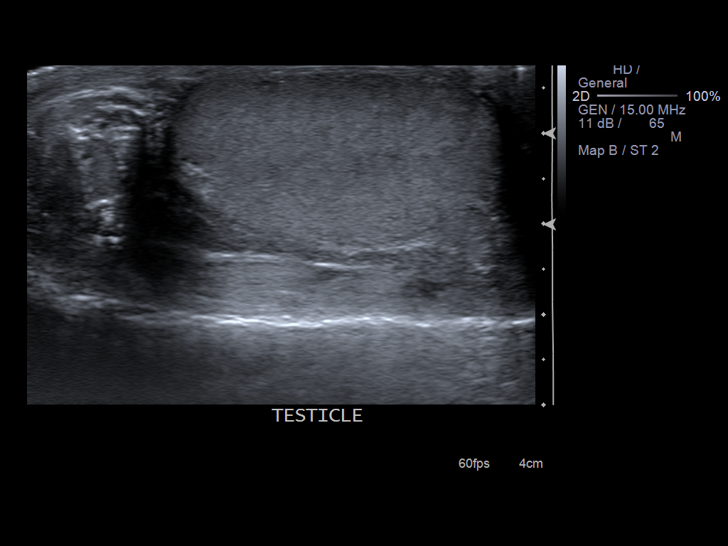
[im 94/113]
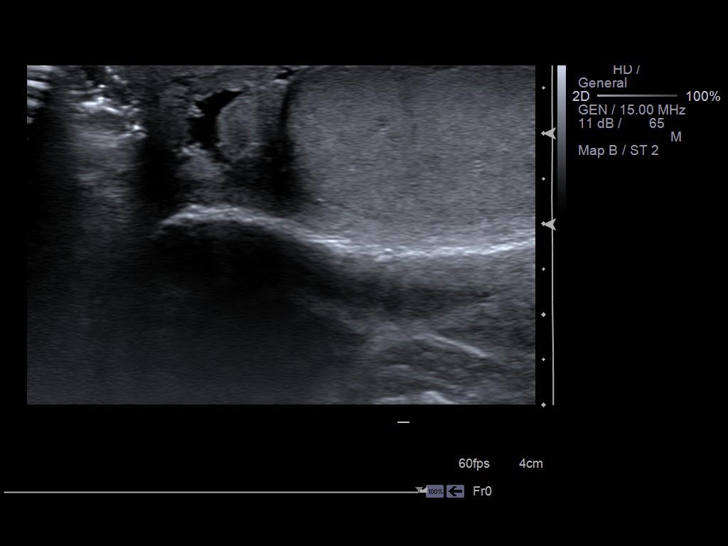
[im 103/113]
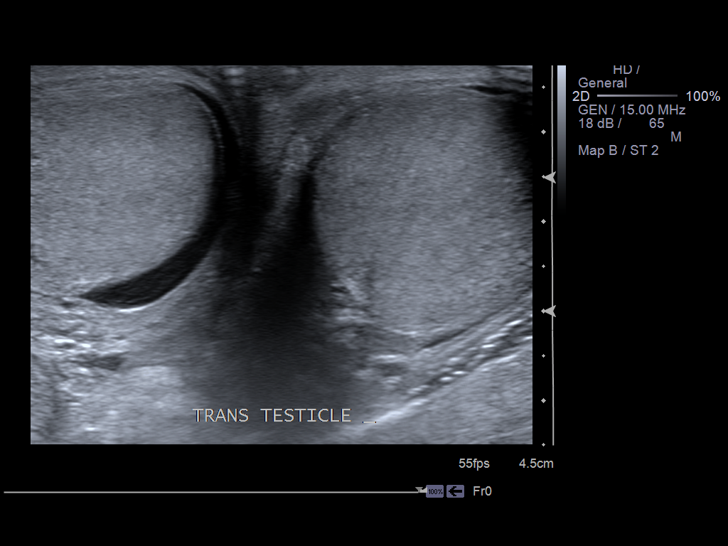
[im 113/113]
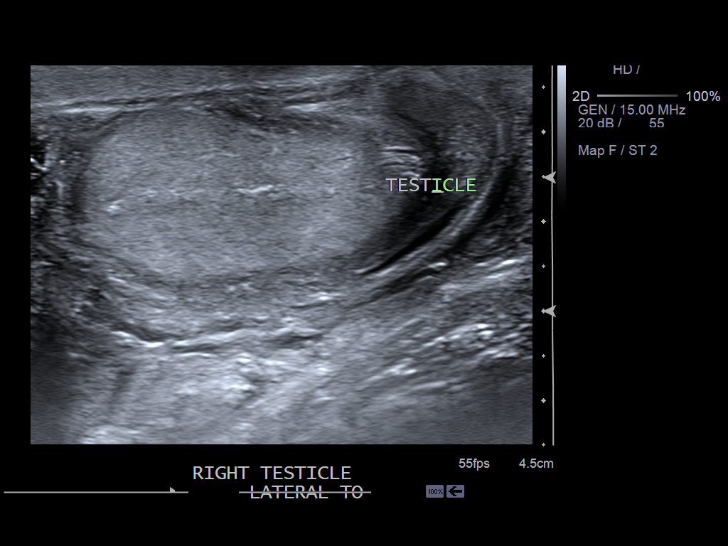

[13 of 25 positions shown; findings below may reference images not displayed]

PROCEDURE:     US  - US TESTICULAR  - December 30, 2012  [DATE]

RESULT:     Grayscale and color flow Doppler evaluation of the scrotal
contents was performed. Comparison is made to the study December 17, 2012.

The testes exhibit normal echotexture and contour and vascularity. No
intratesticular masses are demonstrated. The right testicle measures 4.2 x
2.4 x 2.8 cm. The left testicle measures 3.8 x 2.1 x 3.2 cm.

Vascularity of the epididymal structures is normal. The right epididymis
measures 1.2 x 0.9 x 2.1 cm and contains a 1.3 x 0.7 x 0.9 cm diameter cyst.
The left epididymis measures 1.3 x 0.8 x 1.4 cm. There is a varicocele on
the left. There is a tiny hydrocele on the right.

Inferior to the right testicle there are heterogeneous echotexture
structures is demonstrated which may reflect inflammatory or neoplastic
processe. One measures approximately 2.8 x 2.4 x 4.7 cm. The second measures
3.6 x 2.2 x 4.9 centimeters. The structures exhibit variable degrees of
vascularity.
IMPRESSION: 1. In the right aspect of the scrotum external to the testicle there are 2
complex appearing hypoechoic foci. These are more conspicuous in size than
on the previous study and are better separated from one another. These may
be inflammatory or neoplastic.
2. No abnormality of the testes is demonstrated.
3. There is an epididymal cyst on the right.

The previous CT scan through the abdomen and pelvis on November 02, 2012
demonstrated prominent soft tissues posterior to the urinary bladder which
may been related merely to an enlarged prostate gland. It may be useful to
consider the patient for repeat pelvic CT scan with IV and oral contrast to
reevaluate the pelvis and scrotal structures more completely in an effort to
determine if progressive inflammatory change versus neoplasm is present.

[REDACTED]

## 2014-08-18 ENCOUNTER — Emergency Department: Payer: Self-pay | Admitting: Internal Medicine

## 2014-08-18 LAB — CBC WITH DIFFERENTIAL/PLATELET
BASOS ABS: 0.1 10*3/uL (ref 0.0–0.1)
BASOS PCT: 1.4 %
EOS ABS: 0.2 10*3/uL (ref 0.0–0.7)
EOS PCT: 3.1 %
HCT: 45.7 % (ref 40.0–52.0)
HGB: 14.7 g/dL (ref 13.0–18.0)
Lymphocyte #: 2.6 10*3/uL (ref 1.0–3.6)
Lymphocyte %: 34.5 %
MCH: 30.1 pg (ref 26.0–34.0)
MCHC: 32.1 g/dL (ref 32.0–36.0)
MCV: 94 fL (ref 80–100)
MONO ABS: 0.4 x10 3/mm (ref 0.2–1.0)
Monocyte %: 5.8 %
Neutrophil #: 4.1 10*3/uL (ref 1.4–6.5)
Neutrophil %: 55.2 %
Platelet: 202 10*3/uL (ref 150–440)
RBC: 4.88 10*6/uL (ref 4.40–5.90)
RDW: 14.9 % — AB (ref 11.5–14.5)
WBC: 7.5 10*3/uL (ref 3.8–10.6)

## 2014-08-18 LAB — COMPREHENSIVE METABOLIC PANEL
ALT: 25 U/L
ANION GAP: 8 (ref 7–16)
Albumin: 3.7 g/dL (ref 3.4–5.0)
Alkaline Phosphatase: 130 U/L — ABNORMAL HIGH
BUN: 10 mg/dL (ref 7–18)
Bilirubin,Total: 0.3 mg/dL (ref 0.2–1.0)
CHLORIDE: 104 mmol/L (ref 98–107)
Calcium, Total: 8.8 mg/dL (ref 8.5–10.1)
Co2: 27 mmol/L (ref 21–32)
Creatinine: 1.1 mg/dL (ref 0.60–1.30)
EGFR (African American): >60
EGFR (Non-African Amer.): >60
Glucose: 179 mg/dL — ABNORMAL HIGH (ref 65–99)
Osmolality: 281 (ref 275–301)
Potassium: 4.2 mmol/L (ref 3.5–5.1)
SGOT(AST): 12 U/L — ABNORMAL LOW (ref 15–37)
Sodium: 139 mmol/L (ref 136–145)
TOTAL PROTEIN: 7.5 g/dL (ref 6.4–8.2)

## 2014-08-18 LAB — URINALYSIS, COMPLETE
Bilirubin,UR: NEGATIVE
Glucose,UR: NEGATIVE mg/dL (ref 0–75)
Ketone: NEGATIVE
Nitrite: POSITIVE
PH: 5 (ref 4.5–8.0)
Protein: 75
SPECIFIC GRAVITY: 1.02 (ref 1.003–1.030)
Squamous Epithelial: NONE SEEN

## 2014-08-20 LAB — URINE CULTURE

## 2014-09-11 IMAGING — US US PELVIS LIMITED
1 series · 13 of 25 positions shown · non-contrast
Comparison: none

REASON FOR EXAM: swelling r testicle
COMMENTS:

PROCEDURE:     US  - US TESTICULAR  - February 04, 2013  [DATE]
RESULT:     Scrotal ultrasound dated 02/04/2013 the study was correlated with
a prior study dated 12/30/2012.
TECHNIQUE: Real time sonographic imaging of the scrotum was obtained.

[Series 1: us pelvis limited · 0.08mm/px · 13 of 74 slices shown]
[im 1/74]
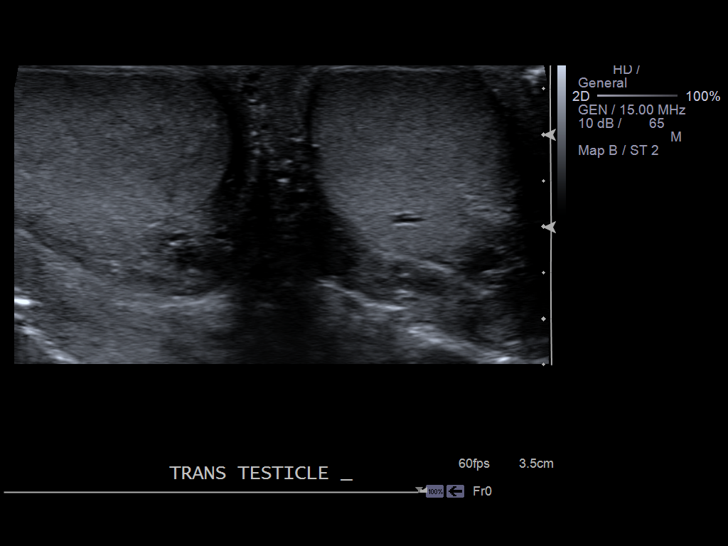
[im 7/74]
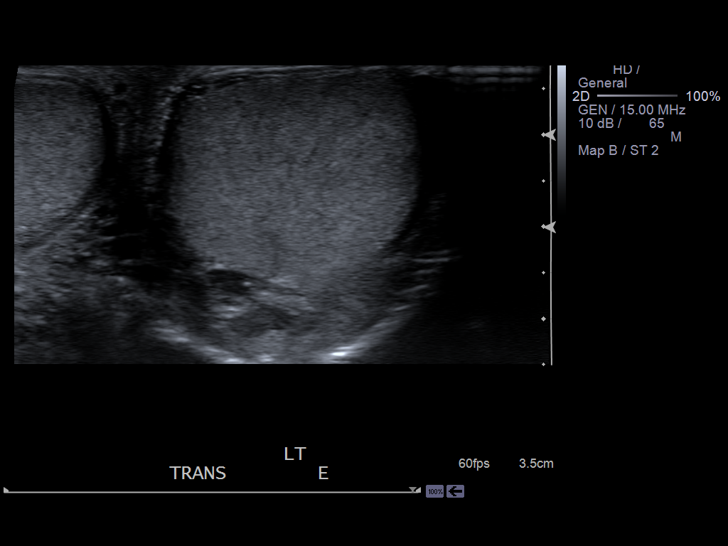
[im 13/74]
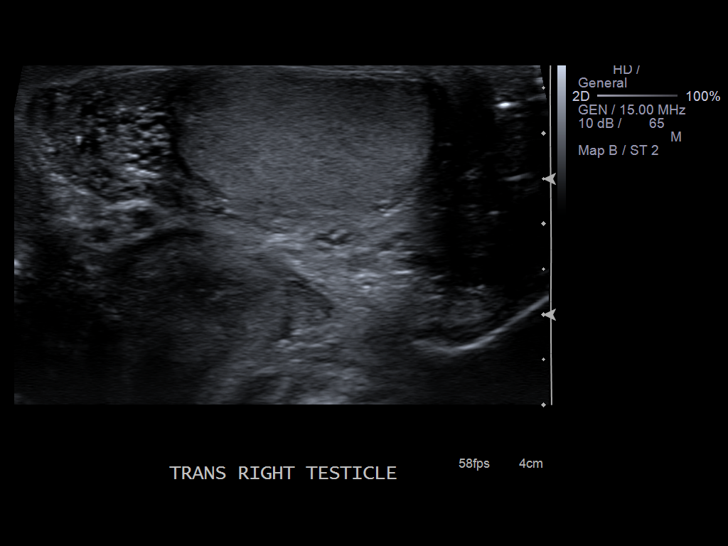
[im 19/74]
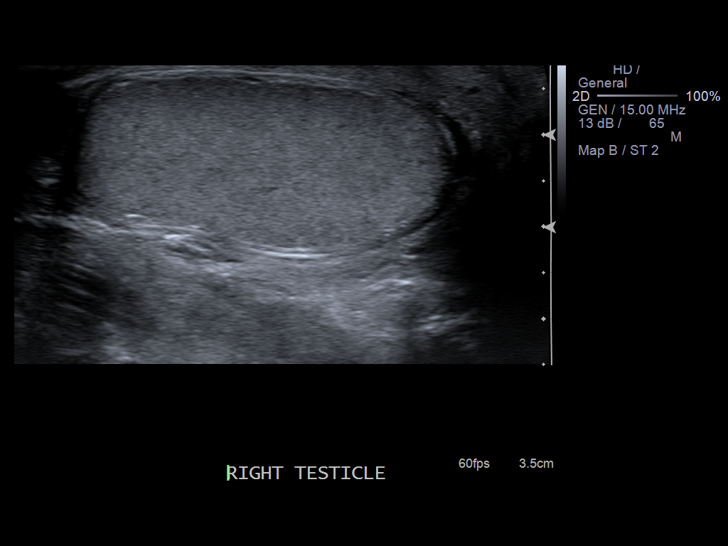
[im 25/74]
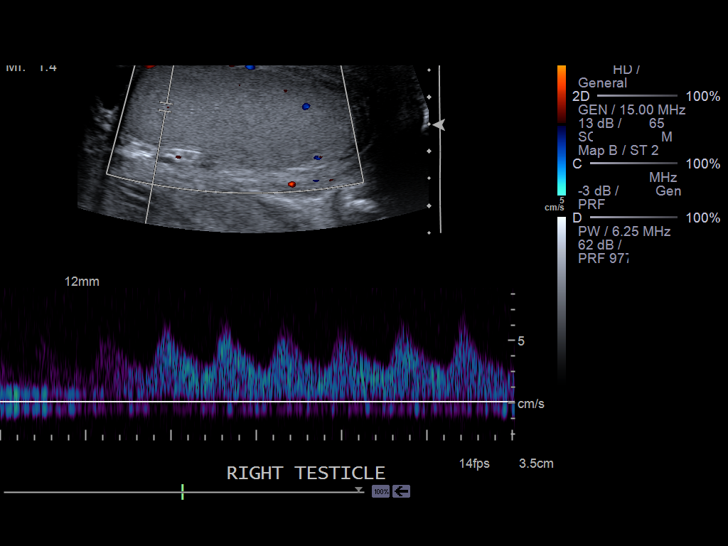
[im 31/74]
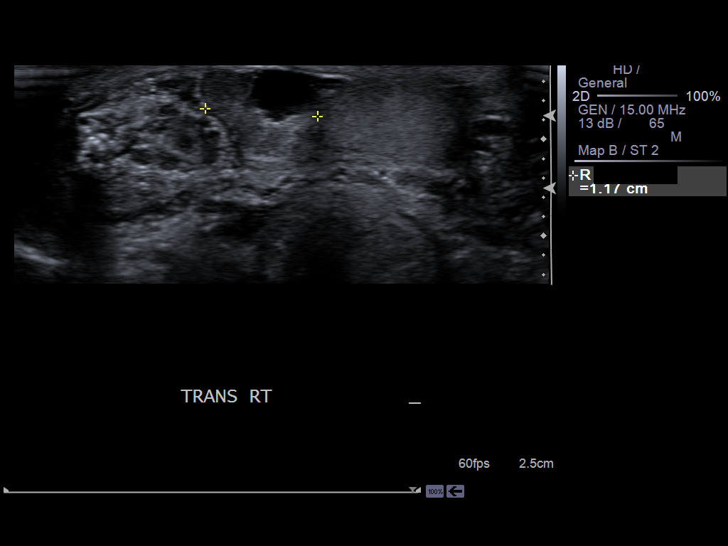
[im 37/74]
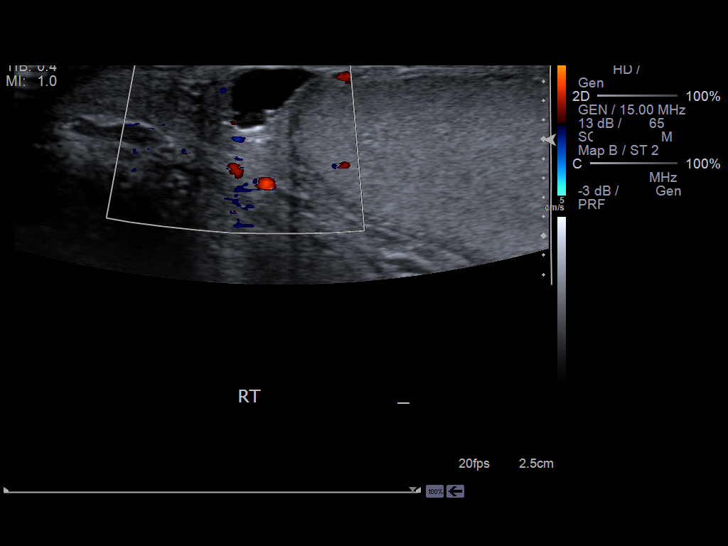
[im 43/74]
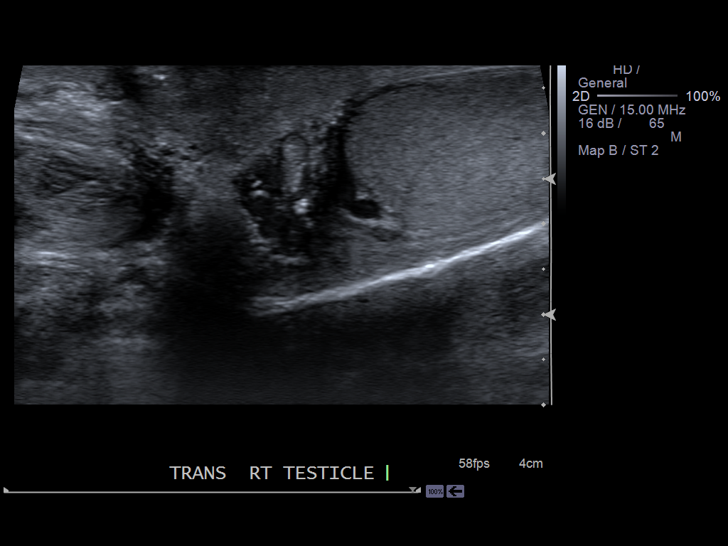
[im 49/74]
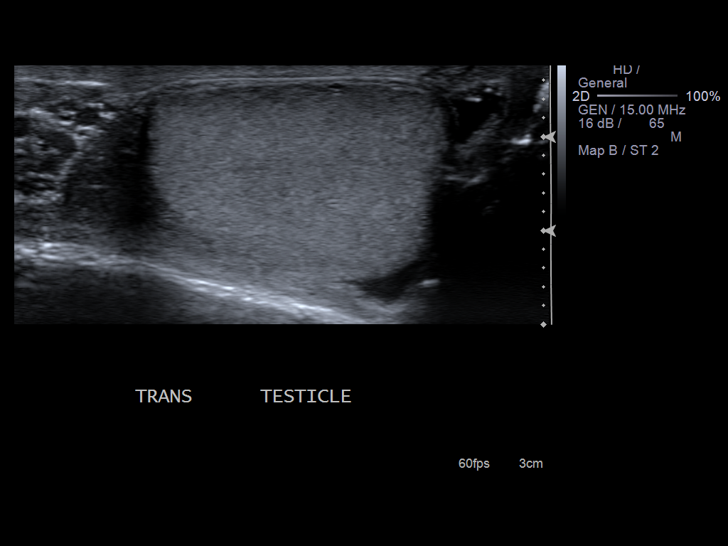
[im 55/74]
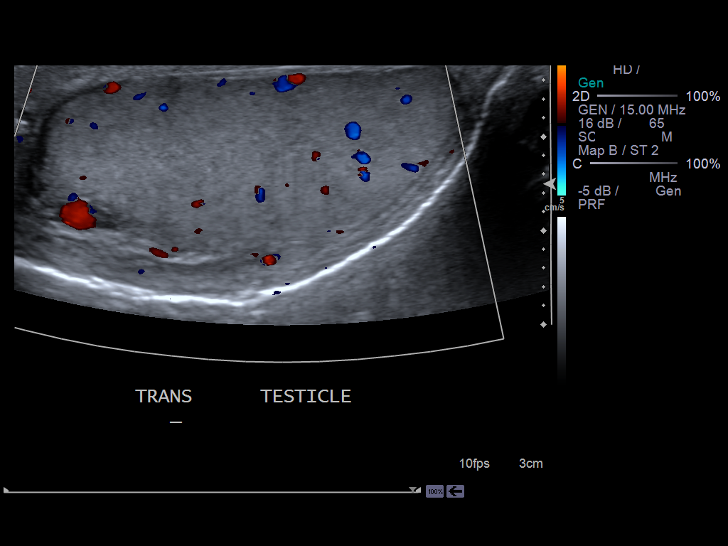
[im 61/74]
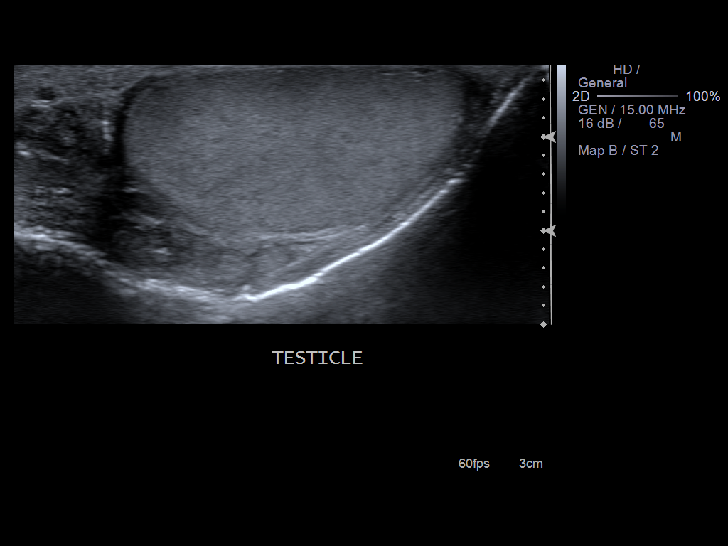
[im 67/74]
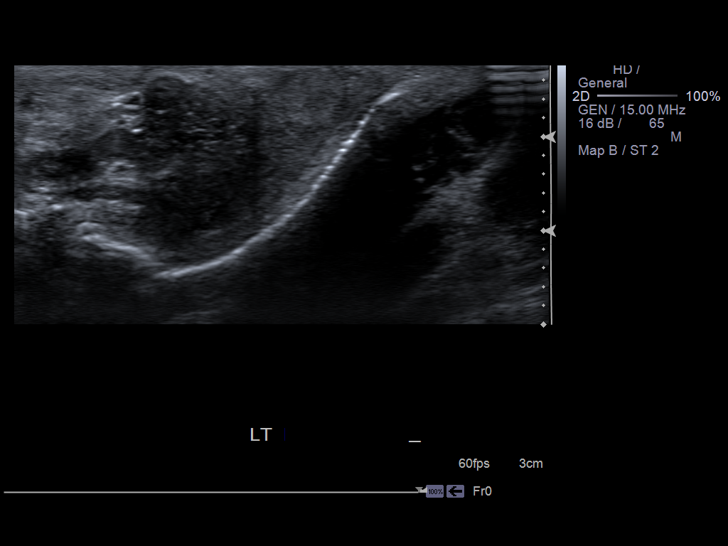
[im 74/74]
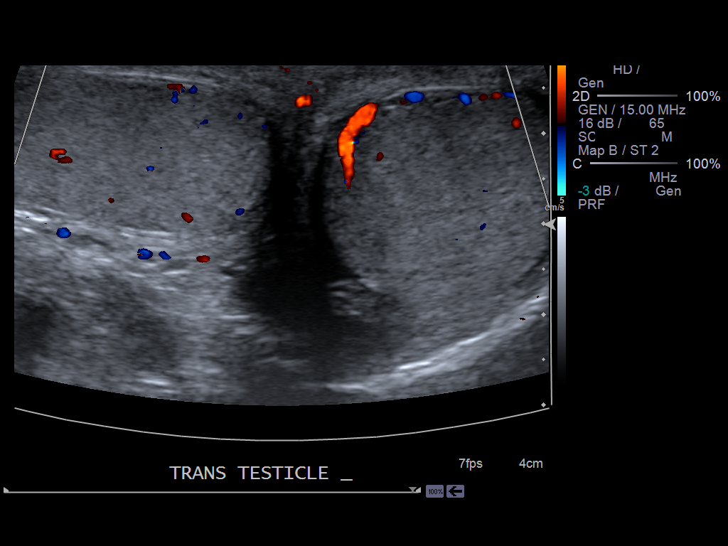

[13 of 25 positions shown; findings below may reference images not displayed]

FINDINGS: The right testicle measures 4.4 x 1.9 x 2.8 cm and the left 4.2 x
2.1 x 2.7 cm. The testes demonstrate homogeneous echotexture. There is
diffuse symmetric color filling of vessels within the testicles. Arterial
and venous waveforms are appreciated within the right and left testicles.
Small bilateral hydroceles are identified. A varicocele is appreciated on
the left. A right epididymal cyst is identified measuring 1.1 x 0.56 a
cm. The right epididymis measures 1.2 x 0.8 by 1.1 centimeters and the left
1.09 x 0.1 x 1.5 cm. The identified otherwise unremarkable. The
heterogeneous areas inferior to the right testicle described on the previous
study are not demonstrated today. The patient has a reported history of
surgical intervention.
IMPRESSION: Right epididymal cyst. Two small bilateral hydroceles
3. Left varicocele
4. Findings consistent with surgical removal of the heterogeneous foci
within the right hemiscrotum.
5. The testicles otherwise unremarkable.

## 2015-04-08 NOTE — H&P (Signed)
PATIENT NAME:  Timothy Clayton, Geo MR#:  [id number] DATE OF BIRTH:  [birth year]  DATE OF ADMISSION:  11/02/2012  PRIMARY CARE PHYSICIAN:  [facility], Dr. Opal SidlesSweeney.  UROLOGIST: [facility], Dr. Marcy Salvoaymond.   CHIEF COMPLAINT: "Urinary catheter problems, my sugar and stomach pain."  HISTORY OF PRESENT ILLNESS: 49 year old African American male with history of poorly controlled diabetes mellitus complicated by what sounds like neurogenic bladder requiring chronic indwelling Foley since April, presents with multiple medical complaints including bladder/catheter pain as well as high sugars. The patient notes that he is on NovoLog 70/30, 55 units twice a day and that despite taking his insulin his sugar is chronically high. Of note, he has not been taking it recently. He presented to the Emergency Department 11/09, which is four days ago, with bladder pain stating that his catheter was painful. He was [facility], however, due to improper draining from the catheter and ongoing abdominal pain, the patient returned to the Emergency Department today. He notes that pain is diffuse. It is 8 out of 10, intermittent. It radiates to the right flank. He has not tried any medications to relieve it. Of note, he was supposed to be on Percocet and Bactrim chronically, but he ran out about one week ago. He denies fevers, but admits to some clear, intermittently bilious, emesis and subsequent decreased oral intake. He denies diarrhea. He endorses constipation. He notes that he has had some hematuria intermittently over the last week or so.  Upon evaluation in the Emergency Department on 11/09 a urine culture which speciated Klebsiella pneumoniae that was resistant to nitrofurantoin, ampicillin and cefoxitin. Attempts were made to contact the patient per records, but those attempts were unsuccessful. He did not receive antibiotics. Upon evaluation in the Emergency Department today, he is noted to be  tachycardic with a white count of 34,000, so he is being admitted for concern of pyelonephritis with sepsis. The patient reports that he has lost about 20 pounds in the last month.   PAST MEDICAL HISTORY:  Diabetes complicated by neurogenic bladder. History of pancreatitis  PAST SURGICAL HISTORY: Denies.   ALLERGIES: No known drug allergies.   MEDICATIONS:  1. Insulin Novolin 70/30, fifty-five units subcutaneously in the morning and in the evening. 2. Percocet 5/325 mg one tablet every four hours as needed for pain.  3. Bactrim DS 1 tablet twice a day.   FAMILY HISTORY: He has a sister with breast cancer and a brother with pancreatic cancer. His father is deceased at age 51 from cerebrovascular accident. His mother is alive, however, he does not know her medical history.   SOCIAL HISTORY: Smokes about 1/2 pack per day for at least 16 years. Denies alcohol or illicit drug use. He is unemployed currently and is on disability.   REVIEW OF SYSTEMS: CONSTITUTIONAL: Denies fever. Admits to fatigue and weight loss. Notes that he has lost about maybe 20 pounds over the last month. EYES: Denies eye pain, blurred vision or double vision. ENT: Denies tinnitus, ear pain, epistaxis. RESPIRATORY: Denies cough, dyspnea, pleuritic chest pain. CARDIOVASCULAR: Denies chest pain, edema, dyspnea on exertion or palpitations. GASTROINTESTINAL: Admits to nausea, vomiting, abdominal pain and constipation. Denies diarrhea, hematemesis, melena, or rectal bleeding. GENITOURINARY: Admits to intermittent dysuria and intermittent hematuria. INTEGUMENT: No skin rashes. MUSCULOSKELETAL: Denies myalgias, arthralgias or joint swelling. NEUROLOGIC: Denies headaches or cerebrovascular accidents or numbness. PSYCH: Denies anxiety or depression.   PHYSICAL EXAMINATION:  VITAL SIGNS: Temperature 98.2, blood pressure 132/74,  respirations 19, heart rate 115.   GENERAL: Cachectic African American male in no apparent distress.    HEENT: There is temporal wasting as well as supraclavicular fat pad is sunken. Pupils are equally round and reactive to light and accommodation. Anicteric sclerae. Normal external ears and nares. Posterior oropharynx is clear. There was no thrush appreciated.   LUNGS: Clear to auscultation bilaterally. No wheezes, rales, or rhonchi. There is normal effort.   HEART: Tachycardic. No murmurs appreciated. There is no extremity edema. Good pulses bilaterally in upper and lower extremities.   ABDOMEN: Soft. There is tenderness in the epigastric region on palpation. No hepatosplenomegaly noted. Decreased bowel sounds.   SKIN: Warm and dry. The skin is extremely dry on observation. There are no erythematous lesions noted.   MUSCULOSKELETAL: Decreased muscle bulk diffusely. No cyanosis. Full strength bilateral upper and lower extremities.   PSYCH: The patient is awake, alert, oriented x3. Judgment appears intact.   LABORATORY, RADIOLOGICAL AND DIAGNOSTIC DATA: Point of care Accu-Chek is 308. BMP shows glucose 315, BUN 21, creatinine 0.91, sodium 128, potassium 4.8, chloride 96, bicarbonate 9, anion gap 23, osmolality 279, calcium 9.9. Total protein 9.1, albumin 3.6, bilirubin 0.6, alkaline phosphatase 311. AST 31, ALT 66. CK 140, CK-MB 3.1. Troponin is 0.02. White count 34.1, hemoglobin 15.6, hematocrit 49, platelets 337, MCV of 94. Urinalysis shows greater than 500 glucose, 2+ ketones, specific gravity 1.021 with a pH of 5, 2+ blood, 30 mg/dL protein, negative nitrites, RBCs 3, white blood cell count 4, trace bacteria. Lactic acid is 1.4   ASSESSMENT AND PLAN: A 49 year old male presenting with abdominal pain, emesis, found to have severe leukocytosis concerning for underlying pyelonephritis as well as severe anion gap metabolic acidosis with ketonuria and mildly elevated glucose concerning for DKA most likely as a result of the underlying infection.  1. Urinary tract infection with sepsis concerning  for underlying pyelonephritis given chronic indwelling Foley catheter. The patient has received Zosyn in the Emergency Department. We will continue Levaquin for now until repeat urine cultures are available. We will continue normal saline resuscitation as well. We will follow up on blood cultures. Will check CT of the abdomen to better delineate the possibility of pyelonephritis.  2. Abdominal pain, nausea and vomiting, most likely due to DKA. We will start the patient on an insulin drip with basic metabolic panels every four hours until the anion gap is closed. Continue normal saline resuscitation and treat the underlying infection.  3. Hyponatremia. This is most likely due to decreased p.o. intake and hypovolemia. We will check urine studies and continue normal saline repletion. We will reassess basic metabolic panel in the morning.  4. Protein calorie malnutrition. The patient reports a 20 pound weight loss in the last month, unclear if this is as a result of severely uncontrolled diabetes versus any underlying process. We will check HIV as well. Check hemoglobin A1c. History does not point to any particular systemic illness otherwise.  5. Diabetes mellitus with DKA. Will initiate an insulin drip and check hemoglobin A1c. We will transition to appropriate insulin doses once anion gap is closed.  6. Anion gap metabolic acidosis due to DKA management as stated above.  7. Prophylaxis. Lovenox.  8. Disposition: The patient will be admitted inpatient for management of sepsis, urinary tract infection, possible pyelonephritis as well as DKA.   CODE STATUS: FULL CODE.   TIME SPENT COORDINATING ADMISSION: 50 minutes.   ____________________________ Aurther Loft, DO aeo:ap D: 11/02/2012 03:03:53 ET T:  11/02/2012 09:48:36 ET JOB#: [id number]  cc: Aurther Loft, DO, <Dictator> Dr. Opal Sidles Dr. Dot Been E Dnya Hickle DO ELECTRONICALLY SIGNED 11/04/2012 1:45

## 2015-04-08 NOTE — Discharge Summary (Signed)
PATIENT NAME:  Timothy Clayton, Timothy Clayton MR#:  [id number] DATE OF BIRTH:  Oct 02, 1966  DATE OF ADMISSION:  11/02/2012 DATE OF DISCHARGE:  11/04/2012  PR[facility] C[facility]   [facility]    DISCHARGE DIAGNOSES:  1. Diabetic ketoacidosis.  2. 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  3. Sepsis.  4. Dehydration.  5. Noncompliance.  6. Acute renal failure.  7. Hyponatremia.  8. Chronic pancreatitis.   CONSULTS: None.   IMAGING STUDIES DONE: CT scan of the abdomen and pelvis showed atrophic changes of the pancreas with diffuse calcifications.   ADMITTING HISTORY AND PHYSICAL: Please see detailed history and physical dictated on 11/02/2012. In brief, this is a 49 year old African American male patient with poorly controlled diabetes mellitus and chronic indwelling Foley secondary to neurogenic bladder who presented to the Emergency Room complaining of abdominal pain, nausea, and vomiting. The patient also was found to be in DKA with elevated anion gap, blood sugars in the 500's, and admitted to the hospitalist service.    HOSPITAL COURSE:  1. Diabetic ketoacidosis. The patient was started on IV insulin along with aggressive IV fluid resuscitation. After his anion gap closed, the patient was transitioned onto D5 diet. On the day of discharge, the patient is doing significantly better, restarted on his home dose of insulin, and blood sugars are at 188. The patient does have some nausea, vomiting, and abdominal pain which is chronic secondary to his chronic pancreatitis but well controlled on the pain medications.  2. Catheter-related urinary tract infection and sepsis. The patient did have elevated white count of 35,000 secondary to the Klebsiella UTI and also dehydration. This is slowly trending down responding to the Levaquin the patient is on. Antibiotics will be continued for five more days. Blood cultures, one set, did grow coagulase  negative Staph, two species. This was thought to be a contaminant.   On day of discharge, the patient has a temperature of 99, pulse 88, blood pressure 135/86 with mild tenderness on abdominal examination without rigidity or guarding and soft and is being discharged to home in a fair condition.   This plan was discussed with the patient and his mother who verbalized understanding and are okay with the plan. I have also advised the patient to not skip any of his insulin which has caused DKA during this admission. The patient is on 55 units twice a day. I have advised him to at least use 20 units if he is unable to eat to prevent DKA.   DISCHARGE MEDICATIONS:  1. Novolin 70/30 55 units subcutaneous twice a day in the morning and bedtime.  2. Percocet 5/325 one capsule oral every six hours as needed for pain.  3. Levaquin 500 mg oral once a day for five days.   DISCHARGE INSTRUCTIONS:  1. Accu-Cheks a.c. and at bedtime.   2. Diabetic diet.  3. Activity as tolerated.  4. Follow-up with primary care physician in a week. The patient does have follow up with his urologist regarding his catheter in two days on 11/06/2012.   This plan was discussed with the patient and his mother.   TIME SPENT: Time spent today on the day of discharge in discharge activity was 40 minutes.   ____________________________ Molinda BailiffSrikar R. Elpidio Thielen, MD srs:drc D: 11/04/2012 12:01:39 ET T: 11/06/2012 10:40:39 ET JOB#: [id number]  cc: Wardell HeathSrikar R. Yuvan Medinger, MD, <Dictator>, [facility], [facility]  Buena Boehm R Mallissa Lorenzen MD ELECTRONICALLY SIGNED 11/06/2012 16:06

## 2015-04-11 NOTE — Consult Note (Signed)
PATIENT NAME:  Timothy Clayton, Timothy Clayton MR#:  [id number] DATE OF BIRTH:  09-Mar-1966  DATE OF CONSULTATION:  01/01/2013  REFERRING PHYSICIAN:  Alford Highland, MD CONSULTING PHYSICIAN:  Timothy Gess. Gordan Grell, MD  REASON FOR CONSULTATION: Scrotal abscess.   HISTORY OF PRESENT ILLNESS: The patient is a 49 year old man with a past history significant for poorly controlled diabetes, chronic pancreatitis, neurogenic bladder and recurrent scrotal abscesses who was admitted on 12/31/2011 with approximately a week or two of increasing pain and swelling in the right side of the scrotum. The patient previously had drainage of a scrotal abscess. He is not aware of any what any cultures may have grown in the past. His care was obtained at [facility] previously. He had some fevers and chills subjectively at home, no significant drainage from the scrotum. He had prior drainage in the past and apparently had a drainage tube placed. It is unclear how extensive his prior surgeries have been, however. He was admitted to the hospital and started on vancomycin and Zosyn. Urology saw him and drained and packed the abscess. It does not appear that any cultures were obtained at the time of the initial I and D. The patient had been on trimethoprim sulfamethoxazole 2 times a day and it is unclear if this was treating cath-related UTI versus the abscesses. Currently he still has pain but is feeling better than prior to admission.   ALLERGIES: No known drug allergies.    PAST MEDICAL HISTORY: 1. Diabetes.  2. Neurogenic bladder status post Foley catheter placement.  3. Chronic pancreatitis.  4. Recurrent scrotal abscesses.  SOCIAL HISTORY: The patient lives with his brother. He smokes 1/2 pack of cigarettes per day. He does not drink. No injecting drug use history.   FAMILY HISTORY: Noncontributory.   REVIEW OF SYSTEMS:    GENERAL: Positive fevers and chills, some malaise.   HEENT: No complaints.   RESPIRATORY: No cough or  shortness of breath.   CARDIAC: No chest pains or palpitations.   NECK: No stiffness. No swollen glands.   GASTROINTESTINAL: Positive nausea and vomiting. No abdominal pain. No change in his bowels.   GENITOURINARY: Positive pain and swelling in the scrotum. He has a chronic Foley catheter in place.   MUSCULOSKELETAL: No complaints.   SKIN: No rashes other than the swelling in the scrotum.   NEUROLOGIC: No focal weakness. His has a chronic neurogenic bladder for which he has a Foley catheter chronically placed.   PSYCHIATRIC: No complaints. All other systems are negative.   PHYSICAL EXAMINATION:  VITAL SIGNS: T-max is 100.1, T-current 98, pulse 96, blood pressure 155/97 and 98% on room air.   GENERAL: A 49 year old black man in no acute distress.   HEENT: Normocephalic, atraumatic. Pupils equal and reactive to light. Extraocular motion intact. Sclerae, conjunctivae and lids are without evidence for emboli or petechiae. Oropharynx shows lips and gums in good condition.   NECK: Midline trachea. No lymphadenopathy. No thyromegaly.   PULMONARY: Chest is clear to auscultation bilaterally with good air movement. No focal consolidation.   CARDIOVASCULAR: Regular rate and rhythm without murmur, rub or gallop.   ABDOMEN: Soft, nontender and nondistended. No hepatosplenomegaly. No hernia is noted.   EXTREMITIES: No evidence for tenosynovitis.   SKIN: No rashes. No stigmata of endocarditis, specifically no Janeway lesions or Osler nodes. There was packing in place on the right side of the scrotum. The scrotum was very tender to touch and indurated. There was no purulent drainage appreciated.   NEUROLOGIC: The  patient was awake and interactive, moving all four extremities.   PSYCHIATRIC: Mood and affect appeared normal.  LABS/RADIOLOGIC STUDIES: BUN is 5 and creatinine 0.54. White count is 11.6 with a hemoglobin of 11.8, platelet count of 318 and ANC of 0.9. White count on admission was  15.1.  Blood cultures show no growth.   A urinalysis had greater than 500 glucose, 1+ ketones, 1+ blood, negative nitrites and 3+ leukocyte esterase. There were 237 red cells and 171 white cells.   A urine culture is growing Staphylococcus aureus that is methicillin sensitive. There is also a gram-negative rod present.  An ultrasound of the testicle demonstrated two complex-appearing areas on the right side of the scrotum.   A CT scan of the abdomen and pelvis with contrast showed enlarged prostate. There was a hypodense area in the right aspect of the scrotum. There was no evidence for urinary tract obstruction or acute bowel abnormality.   IMPRESSION: A 49 year old man with a history of diabetes and neurogenic bladder with chronic Foley catheterization and recurrent scrotal abscesses admitted with scrotal abscess status post I and D.     RECOMMENDATIONS: 1. The patient underwent I and D two days ago. It does not appear that any cultures were obtained at that time. We have no data regarding what prior cultures may have shown. He was apparently on trimethoprim sulfamethoxazole for either chronic suppression or prophylaxis prior to admission.  2. He is currently on broad-spectrum antibiotics. We will continue these for now.  3. We will get a culture of the wound.  4. [facility]  5. We will narrow his antibiotics when more data is available.  6. Unclear what to make of the urine cultures. His blood cultures are negative for staph at this point. With a Foley in place, the urine culture may not be significant.   This is a moderately complex infectious disease case. Thank you very much for involving me in Timothy Clayton's care.  ____________________________ Timothy GessMichael E. Abshir Paolini, MD meb:sb D: 01/01/2013 12:55:18 ET T: 01/01/2013 13:10:23 ET JOB#: [id number]  cc: Timothy GessMichael E. Verlon Carcione, MD, <Dictator> Ilhan Debenedetto E Aysa Larivee MD ELECTRONICALLY SIGNED 01/01/2013 14:19

## 2015-04-11 NOTE — Consult Note (Signed)
Brief Consult Note: Diagnosis: R scrotal abscess.   Patient was seen by consultant.   Consult note dictated.   Recommend to proceed with surgery or procedure.   Recommend further assessment or treatment.   Orders entered.   Discussed with Attending MD.   Comments: Scrotal I&D performed at ED bedside wound packed with 1 4x4 guaze wet to dry change tomorrow wound nurse consult if available daily sitz bath 30 min [facility] f[facility].  Electronic Signatures: Charyl BiggerHouser, Tayna Smethurst Ross (MD)  (Signed 11-Jan-14 16:21)  Authored: Brief Consult Note   Last Updated: 11-Jan-14 16:21 by Charyl BiggerHouser, Bella Brummet Ross (MD)

## 2015-04-11 NOTE — Op Note (Signed)
PATIENT NAME:  Timothy FarrierMCCAIN, Keano MR#:  [id number] DATE OF BIRTH:  January 08, 1966  DATE OF PROCEDURE:  12/30/2012  PREPROCEDURE DIAGNOSIS:  Right scrotal abscess.   POSTPROCEDURE DIAGNOSIS:  Right scrotal abscess.   PROCEDURE PERFORMED: Scrotal incision and drainage at bedside.   SURGEON: Milus HeightEdward Ionia Schey, II  ASSISTANT: None.   ANESTHESIA: IV morphine, 4 mg, administered by nurse 5 minutes prior to procedure and 20 mL of 1% lidocaine with epinephrine instilled locally.   COMPLICATIONS: None.   ESTIMATED BLOOD LOSS: Less than 10 mL.  DRAINS: None.   INDICATION FOR PROCEDURE: A 49 year old with right scrotal abscess. He was fully counseled regarding risks, benefits, and alternatives to scrotal incision and drainage. He provided informed consent.   DESCRIPTION OF PROCEDURE: The patient was identified. He was awake and verbal throughout the entire procedure. He had been given some pain medicine. We waited 5 minutes, prepped out his scrotum appropriately, instilled 20 mL of 1% lidocaine with epinephrine using a 19-gauge needle circumferentially around right scrotal abscess site. After that, he was adequately anesthetized. A #11 blade was used to make a 5 cm incision superiorly to inferiorly over the abscess, resulting in immediate expression of approximately 20 to 30 mL of purulent material. The wound was then irrigated with 100 mL of 50% saline and Betadine mix, and then 20 mL of saline, and packed with a white gauze.  The wound was explored digitally, and there was no additional pocket, and it was hemostatic. At the termination of the procedure, the patient tolerated the procedure well and final dressings were 4 x 4's and mesh panties. He will be transitioned to the primary team.  CONDITION AT THE END OF PROCEDURE: Stable.   DISPOSITION: Primary team, likely to admit the patient. ____________________________ Tawni MillersEdward R. Weldon InchesHouser, II, MD erh:cb D: 12/30/2012 16:45:35 ET T: 12/31/2012 08:19:28  ET JOB#: [id number]  cc: Tawni MillersEdward R. Weldon InchesHouser, II, MD, <Dictator> Beaulah CorinEDWARD R Annaya Bangert MD ELECTRONICALLY SIGNED 12/31/2012 21:08

## 2015-04-11 NOTE — Consult Note (Signed)
Impression: 49yo male w/ h/o DM, neurogenic bladder with chronic foley catheter, recurrent scrotal abscess admitted with scrotal abscess, s/p I&D.  The patient underwent I&D two days ago. It does not appear that any cultures were obtained at that time.  We have no data regarding what prior cultures may have shown.  He was apparently on TMP/SMX for either chronic suppression or prophylaxis prior to admission. He is currently on broad spectrum antibiotics.  Will continue these for now. Will get a culture of the wound. [facility] Will narrow antibiotics when more data is available. Unclear what to make of the urine cultures.  His BCx are negative for Staph at this point.  With a foley in place, the UCx may not be significant.    Electronic Signatures: Sheryn Aldaz, Rosalyn GessMichael E (MD) (Signed on 13-Jan-14 12:48)  Authored   Last Updated: 13-Jan-14 12:53 by Khrystyne Arpin, Rosalyn GessMichael E (MD)

## 2015-04-11 NOTE — Discharge Summary (Signed)
PATIENT NAME:  Timothy FarrierMCCAIN, Zandyr MR#:  [id number] DATE OF BIRTH:  01/06/1966  DATE OF ADMISSION:  12/30/2012 DATE OF DISCHARGE:  01/03/2013  ADMITTING DIAGNOSIS: Scrotal abscess.   DISCHARGE DIAGNOSES:   1.  SIRS due to right scrotal abscess, status post incision and drainage by Dr. Richardson LandryHouser on the 11th of January 2014.   2.  Methicillin-sensitive staphylococcus aureus in wound cultures.   3. Pyuria with methicillin-sensitive staphylococcus aureus, ?UTI 4.  Diabetes mellitus, insulin-dependent. Hemoglobin A1c is not done.  5.  Chronic pancreatitis.  6.  Neurogenic bladder with chronic indwelling Foley catheter.   DISCHARGE CONDITION:  Stable.  DISCHARGE MEDICATIONS: The patient is to resume his outpatient medications which are:  1.  Novolin 70/30, 55 units twice a day.  2.  Acetaminophen/oxycodone 325/5 mg, 1 tablet every 6 hours as needed.  3.  Keflex 500 mg p.o. 3 times daily for 14 days.  4.  Creon 6000 units 1 capsule 3 times a day.  5.  Simethicone 80 mg, 1 tablet 4 times a day.  6.  Nicotine transdermal patch 14 mg topically daily.   DRESSING CARE: The patient is to have dressing changed every 2 days. Home health nurse will be coming to visit him. The right scrotal wound is to be cleaned with normal saline solution then apply calcium alginate, cover with ABD pads and use mesh panties.    HOME OXYGEN: None.   DIET: Salt 2 grams, low fat, low cholesterol, carbohydrate controlled diet, regular consistency.   ACTIVITY LIMITATIONS: As tolerated.   REFERRAL: Home health RN, PT, as well as aide.   FOLLOWUP APPOINTMENT: [facility] primary care physician in 2 days after discharge.   CONSULTANTS:  Dr. Leavy CellaBlocker, infectious disease. Ms. Glee ArvinDonna Partin, nurse for wound care.  Dr. Richardson LandryHouser, urology.   PROCEDURES: Incision and drainage of right scrotum in the Emergency Room at bedside on 12/30/2012. Wound packed with 14/4 gauze, wet-to-dry change was recommended as well as wound nurse consult. The  patient was also advised to have daily Sitz baths, 30 minutes a day.   RADIOLOGIC STUDIES: Ultrasound of testicle 12/30/2012 revealed right aspect of scrotum externally on the testicle complex-appearing hyperechoic foci.  These are most conspicuous in size than on the previous study and are better separated from 1 another. This may be inflammatory or neoplastic according to radiologist. No abnormality of testes was demonstrated. There is an epididymal cyst on the right. The previous CT scans of the abdomen and pelvis on 11/02/2012 demonstrated prominent soft tissues posterior to the urinary bladder which may be related merely to enlarged prostate gland.  It may be useful to consider patient for repeated  pelvic CT scan with IV and oral contrast to evaluate pelvis and skeletal structures more completely in an effort to determine if progressive inflammatory change versus neoplasm is present, according to the radiologist. CT scan of abdomen and pelvis with contrast 12/30/2012 revealed prostate gland appeared to be enlarged and there is abnormal low density in the posterior aspect and in anterior perirectal soft tissues. The margins of the prostate are poorly defined.  The findings are not entirely new and similar were found on CT scan from 11/02/2012.  There is a hypodensity in the right aspect of scrotum which is nonspecific. There is no evidence of urinary tract obstruction or inflammatory change of the upper tract, several nonobstructive stones are present in both kidneys. There is no acute bowel abnormality or acute hepatobiliary abnormality. Calcification within the pancreas is consistent with previous  episodes of acute pancreatitis according to the radiologist.   HISTORY OF PRESENT ILLNESS: The patient is a 49 year old African American male with past medical history significant for history of neurogenic bladder, who has indwelling Foley catheter, who presented to the hospital with complaints of nausea,  vomiting, as well as scrotal abscess. Please refer to DR Tullo's admission note on December 30, 2012. He was seen in the Emergency Room in December 2013 as well as followed at [facility] urology. He was given Septra twice a day indefinitely and his urine cultures from [facility] for apparently was consistent with multi-drug-resistant Klebsiella. He presented to the hospital, to the Emergency Room, and was noted to have a scrotal abscess on the right. He was evaluated by Dr. Richardson Landry of urology who performed I and D at bedside. As the patient was unable to care for scrotal abscess at home, he was admitted to wound management and for control of his diabetes. On arrival to the Emergency Room,patient's blood pressure was noted to be 132/86, pulse was 100 and irregular. Oxygen saturation was 95% on room air. Physical exam revealed erythematous fluctuant scrotal mass on the right side.   LABORATORY DATA:  Done on 12/30/2012 revealed glucose of 381, sodium 133, potassium is 3.3, otherwise BMP was unremarkable. The patient's magnesium level was normal at 2.0. Albumin level was 2.8, alkaline phosphatase 334, otherwise liver enzymes were unremarkable as well. White blood cell count was elevated to 15.1, hemoglobin was 13.1, platelet count 245, absolute neutrophil count was elevated at 12.4. Blood cultures taken on 12/30/2012 showed no growth. Urinalysis reveals Staphylococcus aureus sensitive to all antibiotics but resistant to gentamicin as well as imipenem and intermediately to ciprofloxacin. Urinalysis revealed yellow, turbid urine,  more than 500 glucose, negative for bilirubin, 1+ ketones, specific gravity was 1.031, pH was 71.0, 1+ blood, 30 mg/dl protein, negative for nitrites, 3+ leukocyte esterase were noted, 237 red blood cells as well as 171 white blood cells were noted. No bacteria or epithelial cells were noted, white blood cell clumps, however, were noted as well as budding yeast.   HOSPITAL COURSE:   1.  The patient was  admitted to the hospital for further evaluation. He was started on broad-spectrum antibiotic therapy and consultation with Dr. Leavy Cella was obtained as well as medical records from [facility]. The patient was noted to have wound and urine cultures positive for Staphylococcus aureus. Blood cultures were negative. Dr. Leavy Cella changed his broad-spectrum antibiotic to Keflex.  He recommended to treat patient for 2 weeks.  2.  In regards to pyuria, it was unclear if it was actually infection and or simply colonization of catheter. He recommended to continue Keflex.  The patient's catheter was replaced during this admission.  3.  In regards to her neurogenic bladder, the patient was advised to continue Foley catheter as mentioned above. Foley catheter was replaced.  5.  For diabetes mellitus, patient's diabetes was somewhat better controlled with control of his infection. On the day of discharge, the patient's blood glucose levels were ranging from 170s to 270s.  It is recommended, however, to advance the patient's diabetic medication, insulin, as outpatient. The patient was advised to continue strict diabetic diet.   The patient was consulted by care management and home health medical team was advised. The patient will be prescribed at home health RN for changes of his dressings as well as aide and physical therapy. He is to continue pain medications as necessary and follow up with his PCP at [facility] in  the next few days after discharge.   THE PATIENT'S VITAL SIGNS ON THE DAY OF DISCHARGE: Temperature 98.2, pulse 100 to 110, respirations 18 to 20, blood pressure 148/90, saturation 98% on room air at rest.   TIME SPENT:  40 minutes.    ____________________________ Katharina Caper, MD rv:cs D: 01/03/2013 15:06:00 ET T: 01/03/2013 18:19:52 ET JOB#: [id number]  cc: [facility] Katharina Caper, MD, <Dictator>  Deitrick Ferreri MD ELECTRONICALLY SIGNED 02/08/2013 14:49

## 2015-04-11 NOTE — Consult Note (Signed)
PATIENT NAME:  Timothy Clayton, Timothy Clayton MR#:  [id number] DATE OF BIRTH:  02/21/1966  DATE OF CONSULTATION:  12/30/2012  REFERRING PHYSICIAN:  In the ED. CONSULTING PHYSICIAN:  Tawni Millers. Weldon Inches, MD  REASON FOR CONSULTATION: Scrotal abscess.   HISTORY OF PRESENT ILLNESS: The patient is a 49 year old African American male with an extensive GU history with a history of recurrent scrotal abscess. He has made multiple presentations to [facility] as well as been followed up with his urologist at [facility], his primary urologist. He was most recently here 2 weeks ago. He most recently had a scrotal I and D performed in late December by them. He now presents with several days of increasing pain, tenderness and swelling in the right hemiscrotum. His medical history is consistent with a neurogenic bladder and chronic indwelling Foley catheter since April. He is diabetic at baseline as well. He does note some scrotal drainage. He denies any change in bowel or urinary habits. His Foley catheter has been draining well. He has had a fever. He has been taking Bactrim chronically due to his indwelling Foley catheter per patient. On evaluation in the ER, he had an elevated white count and his diabetes is poorly controlled. I was consulted regarding the scrotal abscess.   PAST MEDICAL HISTORY: Diabetes complicated by neurogenic bladder, history of pancreatitis and alcoholism.   PAST SURGICAL HISTORY: Scrotal I and D.   ALLERGIES: No known drug allergies.   OUTPATIENT MEDICATIONS: Insulin and Bactrim DS twice a day.   FAMILY HISTORY: Noncontributory.   SOCIAL HISTORY: Smoker, former drinker.   REVIEW OF SYSTEMS: Mild fever. No nausea, no vomiting. Balance of 11 systems otherwise negative other than outlined and detailed in the HPI.   PHYSICAL EXAMINATION:   VITAL SIGNS: Temperature 98.7, pulse 112, respiratory rate 18, blood pressure 100/67.  GENERAL: He is an alert and oriented x 3, conversational, interactive,  cachectic African American male in no apparent distress.  NECK: Supple without any lymphadenopathy.  HEENT: Normocephalic, atraumatic. He has poor dentition.  EXTREMITIES: No cyanosis, clubbing or edema.  CHEST: Nonlabored, clear.  CARDIOVASCULAR: Tachycardic, regular rhythm.  ABDOMEN: Soft, nontender, nondistended.  GENITOURINARY: Normal penis and bilateral descended testes. He has a healing midline scrotal I and D site from several weeks ago and a 6 x 3 cm fluctuant area along the right side with soft tissue induration and erythema. There is absolutely no crepitus. There is no tethering of the testicle to the skin. His testicles appear unremarkable. It does not extend posteriorly onto the perineum.  EXTREMITIES: No cyanosis, clubbing or edema. Thin.   MUSCULOSKELETAL: 5/5 strength.  PSYCHIATRIC: Appropriate.   LABORATORY DATA: His creatinine is 0.62 with GFR of greater than 60. His white count is 15.1, hemoglobin and hematocrit 13 and 39, normal platelet count.  RADIOLOGICAL DATA: He had both ultrasound and CT, reviewed by me, initially show that in the right hemiscrotum there is a concern for abscess. He also has inflammatory-like process involving the right testicle. May benefit from repeat ultrasound in a few weeks. CT scan also reviewed, shows enlarged prostate, does not adequately evaluate the scrotum.   ASSESSMENT AND PLAN: This is a 49 year old with: 1.  Right scrotal abscess. Recommended scrotal incision. This was indeed performed at bedside after informed consent was obtained. The wound was packed with sterile gauze. Recommend wet-to-dry dressing change tomorrow and wound consult nurse if available. The patient should have sitz baths and follow up as an outpatient with urologist. Per plan, he has  an appointment scheduled on the 20th. I recommend him seeing him prior to this if able. Recommend repeat scrotal ultrasound to evaluate testicle in a few weeks, likely inflammatory or infectious  in nature consistent with clinical picture. Certainly had no hard mass on evaluation.  2.  Uncontrolled diabetes, corrected.  3.  Neurogenic bladder. Continue indwelling Foley catheter. [facility].  4.  Diabetes management per prior routine.  5.  IV antibiotics per primary team.  6.  Plan was discussed fully with Dr. Darrick Huntsmanullo and will be passed along to the admission team.    ____________________________ Tawni MillersEdward R. Weldon InchesHouser, II, MD erh:jm D: 12/30/2012 16:42:33 ET T: 12/30/2012 20:43:18 ET JOB#: [id number]  cc: Tawni MillersEdward R. Weldon InchesHouser, II, MD, <Dictator> Beaulah CorinEDWARD R Wilmer Santillo MD ELECTRONICALLY SIGNED 12/31/2012 21:08

## 2015-04-11 NOTE — H&P (Signed)
PATIENT NAME:  Timothy Clayton, Timothy Clayton MR#:  [id number] DATE OF BIRTH:  11-Dec-1966  DATE OF ADMISSION:  12/30/2012  CHIEF COMPLAINT: Nausea, vomiting, scrotal abscess.   HISTORY OF PRESENT ILLNESS:  The patient is a 49 year old African American male with a history of uncontrolled diabetes secondary to chronic pancreatitis with neurogenic bladder, chronic indwelling Foley and recurrent scrotal abscesses, who presents with a painful, enlarged, erythematous right scrotum in the context of uncontrolled diabetes and nausea and vomiting. He has been having scrotal swelling for several weeks. He was last seen in the ER in late December, but has not followed up with [facility] this month. His last scrotal drainage was apparently in early December, but records are not available to that affect. He has a history of an indwelling Foley catheter with recurrent UTIs and apparently has been on Septra twice daily indefinitely, according to patient. His last urine culture, per records from [facility], indicate that it was a multidrug-resistant Klebsiella.   The patient was seen in the Emergency Room by Dr. Broadus John of urology, who plans to do an I and D at the bedside pending communication with the staff. The patient is unable to care for his scrotal abscess at home and is being admitted for wound management and for control of uncontrolled diabetes.   The patient's primary care doctor is Dr. Christoper Fabian. He is followed by [facility]. He is also followed by [facility] with last note by Dr. Kyung Rudd.   PAST MEDICAL HISTORY: 1.  Diabetes mellitus, uncontrolled with neurogenic bladder.  2.  Neurogenic bladder with chronic indwelling Foley catheter and frequent UTIs.  3.  History of pancreatitis, 2006.  4.  Recurrent scrotal abscesses, status post recurrent incisions and drainages by [facility].   CURRENT MEDICATIONS: The patient takes 70/30 insulin twice daily, 55 units both evening and morning. He uses Percocet as needed for  pain, but has run out of this. He is on no other medications currently.   PAST SURGICAL HISTORY: Notable only for scrotal incision and drainages. He has no known drug allergies. Last hospitalization November 2013 for pyelonephritis and DKA.   FAMILY HISTORY:  Noncontributory.  There is no family history of diabetes or cancer.   SOCIAL HISTORY: He is a current smoker, 1/2 pack a day for at least 16 years. He states that he has been abstinent of alcohol since 2007. He denies any other drug use. He is unemployed and disabled secondary to chronic illness.   REVIEW OF SYSTEMS: Positive for subjective fevers and continued weight loss. No visual changes of recent. No history of recent sinus pain, redness of oropharynx or difficulty swallowing. No recent history of cough, wheezing, hemoptysis or dyspnea. No recent history of chest pain, orthopnea, edema or dyspnea on exertion. He has noted nausea and vomiting as recently as last night. He has been able to keep down fluids, but no food in 24 hours. He has chronic indwelling Foley catheter due to neurogenic bladder and incontinence, some subjective fevers and chills without any documented fevers. He denies any history of anemia or easy bruising or bleeding. He has no recent changes in the skin or development of rashes. He has chronic abdominal pain secondary to pancreatitis. He denies any numbness, weakness, dysarthria, tremor or dementia. No chronic headaches. He has no history of anxiety, insomnia or depression.   PHYSICAL EXAMINATION: GENERAL: This is a cachectic, middle-aged male who appears comfortable currently. Blood pressure on admission 132/86, repeat similar, no change. Pulse is 100 and  regular. Sats are 95% on room air and patient was endorsing 7 out of 10 pain at the time of exam, but he appeared comfortable.  HEENT:  Pupils equal, round and reactive to light. Extraocular movements are intact. Sclerae are nonicteric. Oropharynx shows poor dentition  with several teeth missing both on top and bottom.  NECK:  Supple without lymphadenopathy, JVD, thyromegaly or carotid bruits.  LUNGS:  Clear to auscultation bilaterally with no wheezing or rhonchi.  CARDIOVASCULAR:  Tachycardic but regular. Chest wall is nontender. Pulses are faint, but palpable in the lower extremities.  ABDOMEN:  Soft, tender in the midepigastric region without hepatosplenomegaly, rebound or guarding. The patient has a very erythematous fluctuant scrotal mass that seems to track and cover a good 50% to 60% of the scrotum. He has an indwelling Foley catheter.  MUSCULOSKELETAL:  Strength is 5/5 in legs and arms.  SKIN:  Dry and warm. There is some inguinal lymphadenopathy without cervical or axillary lymphadenopathy.   NEUROLOGICAL: Cranial nerves II through XII are intact. He is not dysarthric. He has intact sensation bilaterally in lower extremities. He is alert and oriented to person, place and time, and answering appropriately.   ADMISSION LABORATORY DATA:  Sodium 133, potassium 3.3, chloride 97, bicarb 27, BUN 12, creatinine 0.62, glucose 381. White count 15.1, hemoglobin at 13.1, platelets 345. Albumin is low at 2.8. Alk phos is elevated at 334. Urinalysis shows over 500 mg/dL of glucose; 3+ leukocyte esterase, 171 white cells and 237 red cells. Urinalysis was also notable for yeast and white blood cell clumps.   RADIOLOGY:  Testicular ultrasound shows multiple complex foci in the right scrotum. CT of the abdomen and pelvis was repeated at the suggestion of the radiologist, which was unchanged from prior CT showing a 4 x 4 cm multiloculated-appearing soft tissue density on the prostate with extensive pancreatic calcifications and otherwise normal. CT.   ASSESSMENT AND PLAN: 1.  Scrotal abscess:  Admit for wound management. The patient's abscess is to be drained by Dr. Broadus John in the ER pending further evaluation by Dr. Broadus John. Empiric, he would be unable to care for this wound  as an outpatient, which is why he is being admitted for wound management post procedure. Additionally, given his uncontrolled diabetes, he is at high risk for systemic infection. 2.  Diabetes mellitus, type 2, uncontrolled secondary to acute pancreatitis. Continue 70/30 insulin b.i.d. starting with home doses, carbohydrate-controlled diet and sliding scale insulin to manage. We will try to keep his blood sugars under 200.  3.  Urinary tract infection:  The patient has been on chronic Septra for unclear reasons. He does have evidence of a current infection. Urine culture has been ordered. We will hold Septra for now given empiric treatment with vancomycin and Zosyn.  4.  Hyponatremia secondary to hyperglycemia.  5.  Hypokalemia. We will check magnesium level given his cachectic state and replace both IV/p.o. 6.  Protein malnutrition, likely secondary to chronic pancreatitis. The patient is not on pancreatic enzymes for unclear reasons. We will consider trial of these during in-house.  Estimated time of care:  60 minutes    ____________________________ Deborra Medina, MD tt:ce D: 12/30/2012 16:10:00 ET T: 12/30/2012 17:32:18 ET JOB#: [id number]  cc: Deborra Medina, MD, <Dictator> Deborra Medina MD ELECTRONICALLY SIGNED 01/29/2013 19:40

## 2015-08-04 ENCOUNTER — Encounter: Payer: Self-pay | Admitting: Urgent Care

## 2015-08-04 DIAGNOSIS — Z720 Tobacco use: Secondary | ICD-10-CM | POA: Insufficient documentation

## 2015-08-04 DIAGNOSIS — E119 Type 2 diabetes mellitus without complications: Secondary | ICD-10-CM | POA: Diagnosis not present

## 2015-08-04 DIAGNOSIS — R1032 Left lower quadrant pain: Secondary | ICD-10-CM | POA: Diagnosis not present

## 2015-08-04 DIAGNOSIS — R110 Nausea: Secondary | ICD-10-CM | POA: Diagnosis not present

## 2015-08-04 DIAGNOSIS — K5900 Constipation, unspecified: Secondary | ICD-10-CM | POA: Diagnosis not present

## 2015-08-04 DIAGNOSIS — R109 Unspecified abdominal pain: Secondary | ICD-10-CM | POA: Diagnosis present

## 2015-08-04 LAB — COMPREHENSIVE METABOLIC PANEL
ALBUMIN: 3.7 g/dL (ref 3.5–5.0)
ALT: 31 U/L (ref 17–63)
AST: 36 U/L (ref 15–41)
Alkaline Phosphatase: 167 U/L — ABNORMAL HIGH (ref 38–126)
Anion gap: 9 (ref 5–15)
BILIRUBIN TOTAL: 0.6 mg/dL (ref 0.3–1.2)
BUN: 12 mg/dL (ref 6–20)
CHLORIDE: 102 mmol/L (ref 101–111)
CO2: 23 mmol/L (ref 22–32)
Calcium: 8.8 mg/dL — ABNORMAL LOW (ref 8.9–10.3)
Creatinine, Ser: 0.99 mg/dL (ref 0.61–1.24)
GFR calc Af Amer: >60 mL/min (ref >60)
GLUCOSE: 216 mg/dL — AB (ref 65–99)
POTASSIUM: 3.5 mmol/L (ref 3.5–5.1)
Sodium: 134 mmol/L — ABNORMAL LOW (ref 135–145)
Total Protein: 7 g/dL (ref 6.5–8.1)

## 2015-08-04 LAB — CBC
HEMATOCRIT: 40.3 % (ref 40.0–52.0)
Hemoglobin: 13.3 g/dL (ref 13.0–18.0)
MCH: 30 pg (ref 26.0–34.0)
MCHC: 33.1 g/dL (ref 32.0–36.0)
MCV: 90.6 fL (ref 80.0–100.0)
PLATELETS: 229 10*3/uL (ref 150–440)
RBC: 4.44 MIL/uL (ref 4.40–5.90)
RDW: 14.3 % (ref 11.5–14.5)
WBC: 10.4 10*3/uL (ref 3.8–10.6)

## 2015-08-04 LAB — LIPASE, BLOOD: Lipase: <10 U/L — ABNORMAL LOW (ref 22–51)

## 2015-08-04 MED ORDER — ONDANSETRON HCL 4 MG/2ML IJ SOLN
INTRAMUSCULAR | Status: AC
  Administered 2015-08-04: 4 mg via INTRAVENOUS
  Filled 2015-08-04: qty 2

## 2015-08-04 MED ORDER — ONDANSETRON HCL 4 MG/2ML IJ SOLN
4.0000 mg | Freq: Once | INTRAMUSCULAR | Status: AC
  Administered 2015-08-04: 4 mg via INTRAVENOUS

## 2015-08-04 MED ORDER — FENTANYL CITRATE (PF) 100 MCG/2ML IJ SOLN
INTRAMUSCULAR | Status: AC
  Administered 2015-08-04: 50 ug via INTRAVENOUS
  Filled 2015-08-04: qty 2

## 2015-08-04 MED ORDER — FENTANYL CITRATE (PF) 100 MCG/2ML IJ SOLN
50.0000 ug | Freq: Once | INTRAMUSCULAR | Status: AC
  Administered 2015-08-04: 50 ug via INTRAVENOUS

## 2015-08-04 NOTE — ED Notes (Addendum)
Patient presents with c/o LUQ abdominal pain x 2-3 days. (+) N/V reported. Patient advising that he has been unable to eat or sleep since symptoms started. Patient in obvious pain; unable to remain in stationary position; patient in floor and tearful.

## 2015-08-05 ENCOUNTER — Emergency Department: Payer: Medicaid Other

## 2015-08-05 ENCOUNTER — Emergency Department
Admission: EM | Admit: 2015-08-05 | Discharge: 2015-08-05 | Disposition: A | Discharge status: Home / Self Care | Payer: Medicaid Other | Attending: Emergency Medicine | Admitting: Emergency Medicine

## 2015-08-05 DIAGNOSIS — R1032 Left lower quadrant pain: Secondary | ICD-10-CM

## 2015-08-05 HISTORY — DX: Other cystostomy status: Z93.59

## 2015-08-05 HISTORY — DX: Type 2 diabetes mellitus without complications: E11.9

## 2015-08-05 HISTORY — DX: Acute pancreatitis without necrosis or infection, unspecified: K85.90

## 2015-08-05 MED ORDER — FENTANYL CITRATE (PF) 100 MCG/2ML IJ SOLN
50.0000 ug | Freq: Once | INTRAMUSCULAR | Status: AC
  Administered 2015-08-05: 50 ug via INTRAVENOUS
  Filled 2015-08-05: qty 2

## 2015-08-05 MED ORDER — IOHEXOL 240 MG/ML SOLN
25.0000 mL | Freq: Once | INTRAMUSCULAR | Status: AC | PRN
  Administered 2015-08-05: 25 mL via ORAL

## 2015-08-05 MED ORDER — IOHEXOL 300 MG/ML  SOLN
100.0000 mL | Freq: Once | INTRAMUSCULAR | Status: AC | PRN
  Administered 2015-08-05: 100 mL via INTRAVENOUS

## 2015-08-05 MED ORDER — SODIUM CHLORIDE 0.9 % IV BOLUS (SEPSIS)
1000.0000 mL | Freq: Once | INTRAVENOUS | Status: AC
  Administered 2015-08-05: 1000 mL via INTRAVENOUS

## 2015-08-05 MED ORDER — FAMOTIDINE 40 MG PO TABS: 40.0000 mg | ORAL_TABLET | Freq: Every evening | ORAL | Status: DC

## 2015-08-05 MED ORDER — GI COCKTAIL ~~LOC~~
30.0000 mL | Freq: Once | ORAL | Status: AC
  Administered 2015-08-05: 30 mL via ORAL
  Filled 2015-08-05: qty 30

## 2015-08-05 NOTE — ED Provider Notes (Signed)
[facility] Emergency Department Provider Note  ____________________________________________  Time seen: Approximately 336 AM  I have reviewed the triage vital signs and the nursing notes.   HISTORY  Chief Complaint Abdominal Pain    HPI Timothy Clayton is a 49 y.o. male with a history of pancreatitis who comes into the hospital today with stomach pain. The patient reports that he has been having some left-sided abdominal pain for the last 3-4 days and he is unable to eat. He reports he is also felt really nauseous and unable to have a bowel movement. He feels as though his stomach is in knots. He rates his pain at 8 out of 10 in intensity. He also reports that his last good bowel movement was approximately 2 days ago. The patient reports that he is unsure if he's had this pain before but knows that he has had pancreatitis before. He denies any fever or chest pain or shortness of breath he also has had no back pain or leg swelling. The patient was concerned so he decided to come in for further evaluation of this pain.   Past Medical History  Diagnosis Date  . Diabetes mellitus without complication   . Pancreatitis   . Suprapubic catheter     There are no active problems to display for this patient.   History reviewed. No pertinent past surgical history.  Current Outpatient Rx  Name  Route  Sig  Dispense  Refill  . famotidine (PEPCID) 40 MG tablet   Oral   Take 1 tablet (40 mg total) by mouth every evening.   15 tablet   0     Allergies Review of patient's allergies indicates no known allergies.  No family history on file.  Social History Social History  Substance Use Topics  . Smoking status: Current Every Day Smoker  . Smokeless tobacco: None  . Alcohol Use: No    Review of Systems Constitutional: No fever/chills Eyes: No visual changes. ENT: No sore throat. Cardiovascular: Denies chest pain. Respiratory: Denies shortness of  breath. Gastrointestinal: Abdominal pain, nausea, constipation Genitourinary: Negative for dysuria. Musculoskeletal: Negative for back pain. Skin: Negative for rash. Neurological: Negative for headaches, focal weakness or numbness. 10-point ROS otherwise negative.  ____________________________________________   PHYSICAL EXAM:  VITAL SIGNS: ED Triage Vitals  Enc Vitals Group     BP 08/04/15 2222 132/77 mmHg     Pulse Rate 08/04/15 2222 102     Resp 08/04/15 2222 16     Temp 08/04/15 2222 98.9 F (37.2 C)     Temp Source 08/04/15 2222 Oral     SpO2 08/04/15 2222 100 %     Weight 08/04/15 2222 130 lb (58.968 kg)     Height 08/04/15 2222 5\' 8"  (1.727 m)     Head Cir --      Peak Flow --      Pain Score 08/04/15 2222 10     Pain Loc --      Pain Edu? --      Excl. in GC? --     Constitutional: Alert and oriented. Well appearing and in moderate distress. Eyes: Conjunctivae are normal. PERRL. EOMI. Head: Atraumatic. Nose: No congestion/rhinnorhea. Mouth/Throat: Mucous membranes are moist.  Oropharynx non-erythematous. Cardiovascular: Normal rate, regular rhythm. Grossly normal heart sounds.  Good peripheral circulation. Respiratory: Normal respiratory effort.  No retractions. Lungs CTAB. Gastrointestinal: Soft with left lower quadrant tenderness to palpation. And positive bowel sounds Musculoskeletal: No lower extremity tenderness nor edema.  Neurologic:  Normal speech and language. No gross focal neurologic deficits are appreciated.  Skin:  Skin is warm, dry and intact. No rash noted. Psychiatric: Mood and affect are normal.   ____________________________________________   LABS (all labs ordered are listed, but only abnormal results are displayed)  Labs Reviewed  LIPASE, BLOOD - Abnormal; Notable for the following:    Lipase <10 (*)    All other components within normal limits  COMPREHENSIVE METABOLIC PANEL - Abnormal; Notable for the following:    Sodium 134 (*)     Glucose, Bld 216 (*)    Calcium 8.8 (*)    Alkaline Phosphatase 167 (*)    All other components within normal limits  CBC  URINALYSIS COMPLETEWITH MICROSCOPIC ([facility] ONLY)   ____________________________________________  EKG  ED ECG REPORT I, Rebecka Apley, the attending physician, personally viewed and interpreted this ECG.   Date: 08/04/2015  EKG Time: 2238  Rate: 94  Rhythm: normal sinus rhythm  Axis: Normal  Intervals:Incomplete right bundle branch block  ST&T Change: None  ____________________________________________  RADIOLOGY  CT abdomen and pelvis: No acute finding, chronic pancreatitis, right nephrolithiasis, 2 cm left adrenal mass stable from 2014 ____________________________________________   PROCEDURES  Procedure(s) performed: None  Critical Care performed: No  ____________________________________________   INITIAL IMPRESSION / ASSESSMENT AND PLAN / ED COURSE  Pertinent labs & imaging results that were available during my care of the patient were reviewed by me and considered in my medical decision making (see chart for details).  This is a 49 year old male who comes in today with some left sided abdominal pain and its been going on for the past few days. The patient's CT scan is unremarkable and the patient did receive 2 doses of fentanyl. I will also give him a GI cocktail and then reassess his pain.  The patient's pain is improved and he also received a GI cocktail. The patient will be discharged home to follow-up with his primary care physician. ____________________________________________   FINAL CLINICAL IMPRESSION(S) / ED DIAGNOSES  Final diagnoses:  Left lower quadrant pain      Rebecka Apley, MD 08/05/15 [phone]

## 2015-08-05 NOTE — Discharge Instructions (Signed)

## 2015-08-05 NOTE — ED Notes (Signed)
Pt discharged home after verbalizing understanding of discharge instructions; nad noted. 

## 2016-11-28 ENCOUNTER — Inpatient Hospital Stay
Admission: EM | Admit: 2016-11-28 | Discharge: 2016-12-01 | DRG: 698 | Disposition: A | Discharge status: Home / Self Care | Payer: Medicaid Other | Attending: Internal Medicine | Admitting: Internal Medicine

## 2016-11-28 ENCOUNTER — Emergency Department: Payer: Medicaid Other

## 2016-11-28 DIAGNOSIS — E1140 Type 2 diabetes mellitus with diabetic neuropathy, unspecified: Secondary | ICD-10-CM | POA: Diagnosis present

## 2016-11-28 DIAGNOSIS — A09 Infectious gastroenteritis and colitis, unspecified: Secondary | ICD-10-CM | POA: Diagnosis present

## 2016-11-28 DIAGNOSIS — R1084 Generalized abdominal pain: Secondary | ICD-10-CM

## 2016-11-28 DIAGNOSIS — Y846 Urinary catheterization as the cause of abnormal reaction of the patient, or of later complication, without mention of misadventure at the time of the procedure: Secondary | ICD-10-CM | POA: Diagnosis present

## 2016-11-28 DIAGNOSIS — E11319 Type 2 diabetes mellitus with unspecified diabetic retinopathy without macular edema: Secondary | ICD-10-CM | POA: Diagnosis present

## 2016-11-28 DIAGNOSIS — I10 Essential (primary) hypertension: Secondary | ICD-10-CM | POA: Diagnosis present

## 2016-11-28 DIAGNOSIS — K529 Noninfective gastroenteritis and colitis, unspecified: Secondary | ICD-10-CM

## 2016-11-28 DIAGNOSIS — E1165 Type 2 diabetes mellitus with hyperglycemia: Secondary | ICD-10-CM | POA: Diagnosis present

## 2016-11-28 DIAGNOSIS — F17200 Nicotine dependence, unspecified, uncomplicated: Secondary | ICD-10-CM | POA: Diagnosis present

## 2016-11-28 DIAGNOSIS — N318 Other neuromuscular dysfunction of bladder: Secondary | ICD-10-CM | POA: Diagnosis present

## 2016-11-28 DIAGNOSIS — A419 Sepsis, unspecified organism: Secondary | ICD-10-CM | POA: Diagnosis present

## 2016-11-28 DIAGNOSIS — B964 Proteus (mirabilis) (morganii) as the cause of diseases classified elsewhere: Secondary | ICD-10-CM | POA: Diagnosis present

## 2016-11-28 DIAGNOSIS — N390 Urinary tract infection, site not specified: Secondary | ICD-10-CM | POA: Diagnosis present

## 2016-11-28 DIAGNOSIS — T83510A Infection and inflammatory reaction due to cystostomy catheter, initial encounter: Secondary | ICD-10-CM | POA: Diagnosis present

## 2016-11-28 DIAGNOSIS — Z87440 Personal history of urinary (tract) infections: Secondary | ICD-10-CM

## 2016-11-28 DIAGNOSIS — Z794 Long term (current) use of insulin: Secondary | ICD-10-CM | POA: Diagnosis not present

## 2016-11-28 DIAGNOSIS — K860 Alcohol-induced chronic pancreatitis: Secondary | ICD-10-CM | POA: Diagnosis present

## 2016-11-28 DIAGNOSIS — K559 Vascular disorder of intestine, unspecified: Secondary | ICD-10-CM | POA: Diagnosis present

## 2016-11-28 LAB — COMPREHENSIVE METABOLIC PANEL
ALK PHOS: 130 U/L — AB (ref 38–126)
ALT: 62 U/L (ref 17–63)
AST: 53 U/L — AB (ref 15–41)
Albumin: 3.5 g/dL (ref 3.5–5.0)
Anion gap: 8 (ref 5–15)
BUN: 25 mg/dL — AB (ref 6–20)
CALCIUM: 8.7 mg/dL — AB (ref 8.9–10.3)
CHLORIDE: 103 mmol/L (ref 101–111)
CO2: 25 mmol/L (ref 22–32)
CREATININE: 1.53 mg/dL — AB (ref 0.61–1.24)
GFR calc non Af Amer: 51 mL/min — ABNORMAL LOW (ref >60)
GFR, EST AFRICAN AMERICAN: 59 mL/min — AB (ref >60)
Glucose, Bld: 271 mg/dL — ABNORMAL HIGH (ref 65–99)
Potassium: 3.7 mmol/L (ref 3.5–5.1)
SODIUM: 136 mmol/L (ref 135–145)
Total Bilirubin: 1 mg/dL (ref 0.3–1.2)
Total Protein: 7 g/dL (ref 6.5–8.1)

## 2016-11-28 LAB — URINALYSIS, COMPLETE (UACMP) WITH MICROSCOPIC
Bilirubin Urine: NEGATIVE
KETONES UR: NEGATIVE mg/dL
Nitrite: NEGATIVE
PH: 7 (ref 5.0–8.0)
Protein, ur: 100 mg/dL — AB
SPECIFIC GRAVITY, URINE: 1.014 (ref 1.005–1.030)
SQUAMOUS EPITHELIAL / LPF: NONE SEEN

## 2016-11-28 LAB — GLUCOSE, CAPILLARY
GLUCOSE-CAPILLARY: 260 mg/dL — AB (ref 65–99)
Glucose-Capillary: 124 mg/dL — ABNORMAL HIGH (ref 65–99)

## 2016-11-28 LAB — CBC
HCT: 43.1 % (ref 40.0–52.0)
Hemoglobin: 14.6 g/dL (ref 13.0–18.0)
MCH: 31.2 pg (ref 26.0–34.0)
MCHC: 33.8 g/dL (ref 32.0–36.0)
MCV: 92.3 fL (ref 80.0–100.0)
PLATELETS: 200 10*3/uL (ref 150–440)
RBC: 4.67 MIL/uL (ref 4.40–5.90)
RDW: 14.3 % (ref 11.5–14.5)
WBC: 21.3 10*3/uL — ABNORMAL HIGH (ref 3.8–10.6)

## 2016-11-28 LAB — MAGNESIUM: Magnesium: 1.6 mg/dL — ABNORMAL LOW (ref 1.7–2.4)

## 2016-11-28 LAB — LACTIC ACID, PLASMA: Lactic Acid, Venous: 1.5 mmol/L (ref 0.5–1.9)

## 2016-11-28 LAB — MRSA PCR SCREENING: MRSA BY PCR: NEGATIVE

## 2016-11-28 LAB — LIPASE, BLOOD: LIPASE: 12 U/L (ref 11–51)

## 2016-11-28 MED ORDER — PIPERACILLIN-TAZOBACTAM 3.375 G IVPB
3.3750 g | Freq: Three times a day (TID) | INTRAVENOUS | Status: DC
  Administered 2016-11-28 – 2016-12-01 (×8): 3.375 g via INTRAVENOUS
  Filled 2016-11-28 (×8): qty 50

## 2016-11-28 MED ORDER — ONDANSETRON HCL 4 MG/2ML IJ SOLN
4.0000 mg | Freq: Once | INTRAMUSCULAR | Status: AC
  Administered 2016-11-28: 4 mg via INTRAVENOUS
  Filled 2016-11-28: qty 2

## 2016-11-28 MED ORDER — OXCARBAZEPINE 300 MG PO TABS
150.0000 mg | ORAL_TABLET | Freq: Every day | ORAL | Status: DC
  Administered 2016-11-28 – 2016-11-30 (×3): 150 mg via ORAL
  Filled 2016-11-28: qty 1
  Filled 2016-11-28: qty 2
  Filled 2016-11-28: qty 1

## 2016-11-28 MED ORDER — ACETAMINOPHEN 500 MG PO TABS
500.0000 mg | ORAL_TABLET | Freq: Four times a day (QID) | ORAL | Status: DC | PRN
  Administered 2016-11-28: 500 mg via ORAL
  Filled 2016-11-28: qty 1

## 2016-11-28 MED ORDER — ACETAMINOPHEN 500 MG PO TABS
1000.0000 mg | ORAL_TABLET | ORAL | Status: AC
  Administered 2016-11-28: 1000 mg via ORAL
  Filled 2016-11-28: qty 2

## 2016-11-28 MED ORDER — IOPAMIDOL (ISOVUE-300) INJECTION 61%
80.0000 mL | Freq: Once | INTRAVENOUS | Status: AC | PRN
  Administered 2016-11-28: 80 mL via INTRAVENOUS

## 2016-11-28 MED ORDER — OXYCODONE-ACETAMINOPHEN 7.5-325 MG PO TABS
1.0000 | ORAL_TABLET | Freq: Three times a day (TID) | ORAL | Status: DC | PRN
  Administered 2016-11-28 – 2016-12-01 (×5): 1 via ORAL
  Filled 2016-11-28 (×5): qty 1

## 2016-11-28 MED ORDER — PANTOPRAZOLE SODIUM 40 MG PO TBEC
40.0000 mg | DELAYED_RELEASE_TABLET | Freq: Every day | ORAL | Status: DC
  Administered 2016-11-28 – 2016-12-01 (×4): 40 mg via ORAL
  Filled 2016-11-28 (×4): qty 1

## 2016-11-28 MED ORDER — INSULIN GLARGINE 100 UNIT/ML ~~LOC~~ SOLN
12.0000 [IU] | Freq: Every day | SUBCUTANEOUS | Status: DC
  Administered 2016-11-28 – 2016-11-30 (×3): 12 [IU] via SUBCUTANEOUS
  Filled 2016-11-28 (×4): qty 0.12

## 2016-11-28 MED ORDER — SODIUM CHLORIDE 0.9 % IV BOLUS (SEPSIS)
1000.0000 mL | Freq: Once | INTRAVENOUS | Status: AC
  Administered 2016-11-28: 1000 mL via INTRAVENOUS

## 2016-11-28 MED ORDER — SODIUM CHLORIDE 0.9 % IV SOLN
INTRAVENOUS | Status: DC
Start: 2016-11-28 — End: 2016-11-29
  Administered 2016-11-28 – 2016-11-29 (×2): via INTRAVENOUS

## 2016-11-28 MED ORDER — IOPAMIDOL (ISOVUE-300) INJECTION 61%
30.0000 mL | Freq: Once | INTRAVENOUS | Status: AC | PRN
  Administered 2016-11-28: 30 mL via ORAL

## 2016-11-28 MED ORDER — ENOXAPARIN SODIUM 40 MG/0.4ML ~~LOC~~ SOLN
40.0000 mg | SUBCUTANEOUS | Status: DC
  Administered 2016-11-28 – 2016-11-30 (×3): 40 mg via SUBCUTANEOUS
  Filled 2016-11-28 (×3): qty 0.4

## 2016-11-28 MED ORDER — MIRTAZAPINE 15 MG PO TABS
15.0000 mg | ORAL_TABLET | Freq: Every day | ORAL | Status: DC
  Administered 2016-11-28 – 2016-11-30 (×3): 15 mg via ORAL
  Filled 2016-11-28 (×3): qty 1

## 2016-11-28 MED ORDER — PANCRELIPASE (LIP-PROT-AMYL) 12000-38000 UNITS PO CPEP
12000.0000 [IU] | ORAL_CAPSULE | Freq: Three times a day (TID) | ORAL | Status: DC
  Administered 2016-11-28 – 2016-12-01 (×7): 12000 [IU] via ORAL
  Filled 2016-11-28 (×7): qty 1

## 2016-11-28 MED ORDER — VITAMIN D (ERGOCALCIFEROL) 1.25 MG (50000 UNIT) PO CAPS
50000.0000 [IU] | ORAL_CAPSULE | ORAL | Status: DC
  Administered 2016-11-29: 50000 [IU] via ORAL
  Filled 2016-11-28: qty 1

## 2016-11-28 MED ORDER — INSULIN ASPART 100 UNIT/ML ~~LOC~~ SOLN
0.0000 [IU] | Freq: Three times a day (TID) | SUBCUTANEOUS | Status: DC
  Administered 2016-11-28: 3 [IU] via SUBCUTANEOUS
  Administered 2016-11-30: 1 [IU] via SUBCUTANEOUS
  Administered 2016-11-30: 3 [IU] via SUBCUTANEOUS
  Filled 2016-11-28: qty 1
  Filled 2016-11-28: qty 3

## 2016-11-28 MED ORDER — INSULIN ASPART 100 UNIT/ML ~~LOC~~ SOLN
0.0000 [IU] | Freq: Every day | SUBCUTANEOUS | Status: DC
  Administered 2016-11-30: 2 [IU] via SUBCUTANEOUS
  Filled 2016-11-28 (×2): qty 3
  Filled 2016-11-28: qty 2

## 2016-11-28 MED ORDER — CEFTRIAXONE SODIUM 2 G IJ SOLR
2.0000 g | Freq: Once | INTRAMUSCULAR | Status: AC
  Administered 2016-11-28: 2 g via INTRAVENOUS
  Filled 2016-11-28: qty 2

## 2016-11-28 MED ORDER — TRAMADOL HCL 50 MG PO TABS
50.0000 mg | ORAL_TABLET | Freq: Four times a day (QID) | ORAL | Status: DC | PRN
  Administered 2016-11-29 – 2016-12-01 (×2): 50 mg via ORAL
  Filled 2016-11-28 (×2): qty 1

## 2016-11-28 MED ORDER — MAGNESIUM SULFATE IN D5W 1-5 GM/100ML-% IV SOLN
1.0000 g | Freq: Once | INTRAVENOUS | Status: AC
  Administered 2016-11-28: 1 g via INTRAVENOUS
  Filled 2016-11-28: qty 100

## 2016-11-28 MED ORDER — HYDROMORPHONE HCL 1 MG/ML IJ SOLN
1.0000 mg | INTRAMUSCULAR | Status: AC
  Administered 2016-11-28: 1 mg via INTRAVENOUS
  Filled 2016-11-28: qty 1

## 2016-11-28 MED ORDER — PIPERACILLIN-TAZOBACTAM 3.375 G IVPB: 3.3750 g | Freq: Three times a day (TID) | INTRAVENOUS | Status: DC

## 2016-11-28 MED ORDER — CILOSTAZOL 100 MG PO TABS
100.0000 mg | ORAL_TABLET | Freq: Two times a day (BID) | ORAL | Status: DC
  Administered 2016-11-28 – 2016-12-01 (×6): 100 mg via ORAL
  Filled 2016-11-28 (×6): qty 1

## 2016-11-28 MED ORDER — NICOTINE 21 MG/24HR TD PT24
21.0000 mg | MEDICATED_PATCH | Freq: Every day | TRANSDERMAL | Status: DC
  Administered 2016-11-28 – 2016-11-30 (×3): 21 mg via TRANSDERMAL
  Filled 2016-11-28 (×4): qty 1

## 2016-11-28 NOTE — ED Notes (Signed)
Patient's son Cristal DeerChristopher is a Education officer, environmentalpastor and had to go to [employer]. His number is [phone]

## 2016-11-28 NOTE — ED Notes (Signed)
Patient returned from CT

## 2016-11-28 NOTE — Progress Notes (Signed)
Pharmacy Antibiotic Note  Everlene FarrierGabriel Dubs is a 50 y.o. male admitted on 11/28/2016 with intraabdominal infection.   Pharmacy has been consulted for Zosyn dosing.  Plan: Patient received ceftriaxone 1g IV x 1 in ED. Will start patient on Zosyn EI 3.375g IV Q8hr starting at 2200.   Height: 5\' 8"  (172.7 cm) Weight: 133 lb (60.3 kg) IBW/kg (Calculated) : 68.4  Temp (24hrs), Avg:99.2 F (37.3 C), Min:99.2 F (37.3 C), Max:99.2 F (37.3 C)   Recent Labs Lab 11/28/16 0941 11/28/16 1122  WBC 21.3*  --   CREATININE 1.53*  --   LATICACIDVEN  --  1.5    Estimated Creatinine Clearance: 48.2 mL/min (by C-G formula based on SCr of 1.53 mg/dL (H)).    No Known Allergies  Antimicrobials this admission: Ceftriaxone 12/10 >> 12/10 Zosyn 12/10 >>  Dose adjustments this admission: N/A   Microbiology results: 12/10 BCx: pending  12/10 UCx: pending   Pharmacy will continue to monitor and adjust per consult.   Nakyia Dau L 11/28/2016 3:53 PM

## 2016-11-28 NOTE — ED Provider Notes (Addendum)
[facility] Emergency Department Provider Note   ____________________________________________   First MD Initiated Contact with Patient 11/28/16 1019     (approximate)  I have reviewed the triage vital signs and the nursing notes.   HISTORY  Chief Complaint Abdominal Pain  HPI Timothy Clayton is a 50 y.o. male history of diabetes, pancreatitis, and a suprapubic catheter  Patient reports for the last 2 days had slow and steadily worsening pain in the right mid flank and right upper abdomen. Associated with nausea, chills.  Describes a severe discomfort below the right ribs in his abdomen and flank. He has not noticed any blood in his suprapubic catheter, but does report urinary has been slightly darker. Denies any obstruction or feeling like he can't void.  No longer uses alcohol he states cause problems with his pancreas previous.   Past Medical History:  Diagnosis Date  . Diabetes mellitus without complication (HCC)   . Pancreatitis   . Suprapubic catheter (HCC)     There are no active problems to display for this patient.   History reviewed. No pertinent surgical history.  Prior to Admission medications   Medication Sig Start Date End Date Taking? Authorizing Provider  acetaminophen (TYLENOL) 500 MG tablet Take 500 mg by mouth every 6 (six) hours as needed for moderate pain.    Yes Historical Provider, MD  cilostazol (PLETAL) 100 MG tablet Take 100 mg by mouth 2 (two) times daily.   Yes Historical Provider, MD  insulin glargine (LANTUS) 100 UNIT/ML injection Inject 12 Units into the skin at bedtime.   Yes Historical Provider, MD  mirtazapine (REMERON) 15 MG tablet Take 15 mg by mouth at bedtime.   Yes Historical Provider, MD  omeprazole (PRILOSEC) 20 MG capsule Take 20 mg by mouth daily.   Yes Historical Provider, MD  OXcarbazepine (TRILEPTAL) 150 MG tablet Take 150 mg by mouth at bedtime.   Yes Historical Provider, MD  oxyCODONE-acetaminophen  (PERCOCET) 7.5-325 MG tablet Take 1 tablet by mouth every 8 (eight) hours as needed for severe pain.    Yes Historical Provider, MD  Pancrelipase, Lip-Prot-Amyl, 6000 units CPEP Take 6,000 Units by mouth 3 (three) times daily with meals.   Yes Historical Provider, MD  Vitamin D, Ergocalciferol, (DRISDOL) 50000 units CAPS capsule Take 50,000 Units by mouth 2 (two) times a week. On Tuesday and Thursday   Yes Historical Provider, MD    Allergies Patient has no known allergies.  No family history on file.  Social History Social History  Substance Use Topics  . Smoking status: Current Every Day Smoker  . Smokeless tobacco: Never Used  . Alcohol use No    Review of Systems Constitutional: No fever., Fatigue, some chills Eyes: No visual changes. ENT: No sore throat. Cardiovascular: Denies chest pain. Respiratory: Denies shortness of breath. Gastrointestinal:   No diarrhea.  No constipation. Genitourinary: Negative for dysuria. Musculoskeletal: Negative for back pain. Skin: Negative for rash. Neurological: Negative for headaches, focal weakness or numbness.  10-point ROS otherwise negative.  ____________________________________________   PHYSICAL EXAM:  VITAL SIGNS: ED Triage Vitals  Enc Vitals Group     BP 11/28/16 0931 107/66     Pulse Rate 11/28/16 0931 (!) 111     Resp 11/28/16 0931 17     Temp 11/28/16 0931 99.2 F (37.3 C)     Temp Source 11/28/16 0931 Oral     SpO2 11/28/16 0931 95 %     Weight 11/28/16 0931 133 lb (60.3  kg)     Height 11/28/16 0931 5\' 8"  (1.727 m)     Head Circumference --      Peak Flow --      Pain Score 11/28/16 0932 6     Pain Loc --      Pain Edu? --      Excl. in GC? --     Constitutional: Alert and oriented. Standing up, and going back from the bathroom but holding his right flank and appears very uncomfortable. Appears in moderate to severe pain. He is very pleasant and amicable. Eyes: Conjunctivae are normal. PERRL. EOMI. Head:  Atraumatic. Nose: No congestion/rhinnorhea. Mouth/Throat: Mucous membranes are slightly dry.  Oropharynx non-erythematous. Neck: No stridor.   Cardiovascular: Normal rate, regular rhythm. Grossly normal heart sounds.  Good peripheral circulation. Respiratory: Normal respiratory effort.  No retractions. Lungs CTAB. Gastrointestinal: Soft and exquisitely tender in the right upper quadrant and right mid abdomen. No distention. No tenderness over the left abdomen or suprapubically No distention. No abdominal bruits. No CVA tenderness. Musculoskeletal: No lower extremity tenderness nor edema.  No joint effusions. Suprapubic catheter site clean dry and intact, draining slightly dark but otherwise clear appearing Neurologic:  Normal speech and language. No gross focal neurologic deficits are appreciated. No gait instability. Skin:  Skin is warm, dry and intact. No rash noted. Psychiatric: Mood and affect are normal. Speech and behavior are normal.  ____________________________________________   LABS (all labs ordered are listed, but only abnormal results are displayed)  Labs Reviewed  COMPREHENSIVE METABOLIC PANEL - Abnormal; Notable for the following:       Result Value   Glucose, Bld 271 (*)    BUN 25 (*)    Creatinine, Ser 1.53 (*)    Calcium 8.7 (*)    AST 53 (*)    Alkaline Phosphatase 130 (*)    GFR calc non Af Amer 51 (*)    GFR calc Af Amer 59 (*)    All other components within normal limits  CBC - Abnormal; Notable for the following:    WBC 21.3 (*)    All other components within normal limits  URINALYSIS, COMPLETE (UACMP) WITH MICROSCOPIC - Abnormal; Notable for the following:    Color, Urine YELLOW (*)    APPearance TURBID (*)    Glucose, UA >=500 (*)    Hgb urine dipstick LARGE (*)    Protein, ur 100 (*)    Leukocytes, UA LARGE (*)    Bacteria, UA MANY (*)    All other components within normal limits  MAGNESIUM - Abnormal; Notable for the following:    Magnesium 1.6 (*)     All other components within normal limits  URINE CULTURE  CULTURE, BLOOD (ROUTINE X 2)  CULTURE, BLOOD (ROUTINE X 2)  LIPASE, BLOOD  LACTIC ACID, PLASMA   ____________________________________________  EKG Reviewed and interpreted by me at 9:36 AM Ventricular rate 111 QRS 90 QTc 470 Sinus tachycardia, mild prolonged QT, probable left ventricular hypertrophy  ____________________________________________  RADIOLOGY  Ct Abdomen Pelvis W Contrast  Result Date: 11/28/2016 CLINICAL DATA:  Abdominal pain with anorexia for to 3 days. History of pancreatitis and suprapubic catheter. EXAM: CT ABDOMEN AND PELVIS WITH CONTRAST TECHNIQUE: Multidetector CT imaging of the abdomen and pelvis was performed using the standard protocol following bolus administration of intravenous contrast. CONTRAST:  80mL ISOVUE-300 IOPAMIDOL (ISOVUE-300) INJECTION 61% COMPARISON:  08/05/2015 FINDINGS: Lower chest: No acute abnormality. Stable right lower lobe subpleural 5 mm ground-glass pulmonary nodule. Hepatobiliary: No  focal liver abnormality is seen. No gallstones, gallbladder wall thickening, or intra hepatic biliary dilatation. There is a long segment of smooth narrowing of the distal extrahepatic common bile duct best seen on the coronal images, image 35/79, sequence 5. Pancreas: Coarse calcifications throughout the atrophic pancreatic parenchyma are suggestive of chronic pancreatitis. Mild peripancreatic fat stranding may be seen with element of acute pancreatitis. The main pancreatic duct is diffusely dilated. Spleen: Normal in size without focal abnormality. Adrenals/Urinary Tract: Normal appearance of the right adrenal gland. There are bilateral few mm nonobstructive renal calculi in the lower poles. Small 6 mm probable left renal cyst is seen in the lower pole as well. There is a mild left hydronephrosis. The left ureter is also hyperenhancing and dilated to the level of the distal 2/3 of the ureter. No  radiopaque obstructing stone is seen within the ureter. There is a 1.9 by 2.1 cm left adrenal mass. The urinary bladder is poorly evaluated as it is collapsed around suprapubic urinary Foley. Stomach/Bowel: Stomach is within normal limits. No evidence of bowel wall distention or obstruction. There is diffuse smooth wall thickening of the rectum and parts of the descending colon. Vascular/Lymphatic: Aortic atherosclerosis. No enlarged abdominal or pelvic lymph nodes. Diffuse mesenteric stranding is seen. Reproductive: Prostate is unremarkable. Other: No abdominal wall hernia or abnormality. No abdominopelvic ascites. Musculoskeletal: No acute or significant osseous findings. IMPRESSION: Findings of chronic pancreatitis with mild peripancreatic fat stranding which suggests an element of acute pancreatitis. Long segment of narrowing of the distal extrahepatic common bile duct and diffuse dilation of the main pancreatic duct. These findings may be sequela of chronic compression from the heavily calcified pancreatic parenchyma, however a mass at the ampulla is difficult to exclude. Long segment of smooth mucosal thickening of the descending colon, and rectum. Findings may represent ischemic or infectious colitis. Malignancy although possible is favored less likely. Left hydronephrosis and proximal to mid hydroureter, without identifiable radiopaque ureteral calculus. The origin of left ureteral obstruction remains are known. Please note that the left ureter is not opacified on the delayed sequences and therefore is not well visualized. 2.1 cm left adrenal mass, stable from 2014. The long-term stability favors benign etiology. Electronically Signed   By: Ted Mcalpineobrinka  Dimitrova M.D.   On: 11/28/2016 15:00    ____________________________________________   PROCEDURES  Procedure(s) performed: None  Procedures  Critical Care performed: No  ____________________________________________   INITIAL IMPRESSION /  ASSESSMENT AND PLAN / ED COURSE  Pertinent labs & imaging results that were available during my care of the patient were reviewed by me and considered in my medical decision making (see chart for details).  Differential diagnosis includes but is not limited to, abdominal perforation, aortic dissection, cholecystitis, appendicitis, diverticulitis, colitis, esophagitis/gastritis, kidney stone, pyelonephritis, urinary tract infection, aortic aneurysm. All are considered in decision and treatment plan. Based upon the patient's presentation and risk factors, concerned about intra-abdominal pathology. The patient is mildly tachycardic, notable leukocytosis, and certainly focal right mid to right upper abdominal pain. We'll proceed with CT scan, in addition adding lactic acid, blood cultures. Treating pain with hydromorphone and Zofran, IV fluids. ----------------------------------------- 3:31 PM on 11/28/2016 -----------------------------------------  Patient's pain improved. Hemodynamics improving. Based on tachycardia, leukocytosis, and urinalysis findings along with CT I suspect the patient is likely experiencing sepsis, possibly from urinary or gastrointestinal source/colitis. He has multiple findings on CT, and I suspect he would benefit from a gastroenterology consult. There is no evidence of acute biliary obstruction by labs. I  advised and recommended admission to the hospital for antibiotics and GI consultation. Hospitalist service will be admitting the patient.    Clinical Course      ____________________________________________   FINAL CLINICAL IMPRESSION(S) / ED DIAGNOSES  Final diagnoses:  Urinary tract infection, acute  Colitis      NEW MEDICATIONS STARTED DURING THIS VISIT:  New Prescriptions   No medications on file     Note:  This document was prepared using Dragon voice recognition software and may include unintentional dictation errors.     Sharyn CreamerMark Marquise Lambson,  MD 11/28/16 1534    Sharyn CreamerMark Salimah Martinovich, MD 12/18/16 [phone]

## 2016-11-28 NOTE — Progress Notes (Signed)
Family Meeting Note  Advance Directive NO Today a meeting took place with the patient Family not available in the ER  The following clinical team members were present during this meeting was pateitn The following were discussed:Patient's diagnosis: , Patient's progosis:  and Goals for treatment:  pt is being admitted with sepsis due to UTI and Colitis He remains FULL CODE  Time spent 18 mins  Christopherjame Carnell, MD

## 2016-11-28 NOTE — Progress Notes (Signed)
Spoke with Dr.Hugelmyer about patient's temp of 102.9. No new orders given at this time. Timothy Clayton,Timothy Clayton

## 2016-11-28 NOTE — H&P (Signed)
[facility] at [facility]   PATIENT NAME: Timothy Clayton    MR#:  [id number]  DATE OF BIRTH:  1966-02-08  DATE OF ADMISSION:  11/28/2016  PRIMARY CARE PHYSICIAN: No PCP Per Patient   REQUESTING/REFERRING PHYSICIAN: Dr. Fanny Bienquale  CHIEF COMPLAINT:   Abdominal pain right upper and flank for 2 days and diarrhea HISTORY OF PRESENT ILLNESS:  Timothy Clayton  is a 50 y.o. male with a known history of Chronic pancreatitis alcohol-related quit drinking alcohol a few years ago, neurogenic bladder due to diabetes status post suprapubic catheter, uncontrolled diabetes on insulin, neuropathy, retinopathy comes to the emergency room having complaints of right upper quadrant and flank pain with 2 days of diarrhea Workup in the emergency room showed patient has urinary tract infection and CT abdomen showed evidence of colitis given abdominal symptoms. Patient received IV Rocephin however we'll start him on Zosyn to cover colitis. Patient is being admitted secondary to sepsis from UTI and colitis. No family present in the emergency room.  PAST MEDICAL HISTORY:   Past Medical History:  Diagnosis Date  . Diabetes mellitus without complication (HCC)   . Pancreatitis   . Suprapubic catheter (HCC)     PAST SURGICAL HISTOIRY:  History reviewed. No pertinent surgical history.  SOCIAL HISTORY:   Social History  Substance Use Topics  . Smoking status: Current Every Day Smoker  . Smokeless tobacco: Never Used  . Alcohol use No    FAMILY HISTORY:  No family history on file.  DRUG ALLERGIES:  No Known Allergies  REVIEW OF SYSTEMS:  Review of Systems  Constitutional: Negative for chills, fever and weight loss.  HENT: Negative for ear discharge, ear pain and nosebleeds.   Eyes: Negative for blurred vision, pain and discharge.  Respiratory: Negative for sputum production, shortness of breath, wheezing and stridor.   Cardiovascular: Negative for chest pain,  palpitations, orthopnea and PND.  Gastrointestinal: Positive for abdominal pain, diarrhea and nausea. Negative for vomiting.  Genitourinary: Negative for frequency and urgency.  Musculoskeletal: Negative for back pain and joint pain.  Neurological: Positive for weakness. Negative for sensory change, speech change and focal weakness.  Psychiatric/Behavioral: Negative for depression and hallucinations. The patient is not nervous/anxious.      MEDICATIONS AT HOME:   Prior to Admission medications   Medication Sig Start Date End Date Taking? Authorizing Provider  acetaminophen (TYLENOL) 500 MG tablet Take 500 mg by mouth every 6 (six) hours as needed for moderate pain.    Yes Historical Provider, MD  cilostazol (PLETAL) 100 MG tablet Take 100 mg by mouth 2 (two) times daily.   Yes Historical Provider, MD  insulin glargine (LANTUS) 100 UNIT/ML injection Inject 12 Units into the skin at bedtime.   Yes Historical Provider, MD  mirtazapine (REMERON) 15 MG tablet Take 15 mg by mouth at bedtime.   Yes Historical Provider, MD  omeprazole (PRILOSEC) 20 MG capsule Take 20 mg by mouth daily.   Yes Historical Provider, MD  OXcarbazepine (TRILEPTAL) 150 MG tablet Take 150 mg by mouth at bedtime.   Yes Historical Provider, MD  oxyCODONE-acetaminophen (PERCOCET) 7.5-325 MG tablet Take 1 tablet by mouth every 8 (eight) hours as needed for severe pain.    Yes Historical Provider, MD  Pancrelipase, Lip-Prot-Amyl, 6000 units CPEP Take 6,000 Units by mouth 3 (three) times daily with meals.   Yes Historical Provider, MD  Vitamin D, Ergocalciferol, (DRISDOL) 50000 units CAPS capsule Take 50,000 Units by mouth 2 (two) times  a week. On Tuesday and Thursday   Yes Historical Provider, MD      VITAL SIGNS:  Blood pressure 134/89, pulse 98, temperature 99.2 F (37.3 C), temperature source Oral, resp. rate 16, height 5\' 8"  (1.727 m), weight 60.3 kg (133 lb), SpO2 96 %.  PHYSICAL EXAMINATION:  GENERAL:  50 y.o.-year-old  patient lying in the bed with no acute distress.  EYES: Pupils equal, round, reactive to light and accommodation. No scleral icterus. Extraocular muscles intact.  HEENT: Head atraumatic, normocephalic. Oropharynx and nasopharynx clear.Oral mucosa dry  NECK:  Supple, no jugular venous distention. No thyroid enlargement, no tenderness.  LUNGS: Normal breath sounds bilaterally, no wheezing, rales,rhonchi or crepitation. No use of accessory muscles of respiration.  CARDIOVASCULAR: S1, S2 normal. No murmurs, rubs, or gallops.  ABDOMEN: Soft, nontender, nondistended. Bowel sounds present. No organomegaly or mass. Suprapubic catheter EXTREMITIES: No pedal edema, cyanosis, or clubbing.  NEUROLOGIC: Cranial nerves II through XII are intact. Muscle strength 5/5 in all extremities. Sensation intact. Gait not checked.  PSYCHIATRIC: The patient is alert and oriented x 3.  SKIN: No obvious rash, lesion, or ulcer.   LABORATORY PANEL:   CBC  Recent Labs Lab 11/28/16 0941  WBC 21.3*  HGB 14.6  HCT 43.1  PLT 200   ------------------------------------------------------------------------------------------------------------------  Chemistries   Recent Labs Lab 11/28/16 0941  NA 136  K 3.7  CL 103  CO2 25  GLUCOSE 271*  BUN 25*  CREATININE 1.53*  CALCIUM 8.7*  MG 1.6*  AST 53*  ALT 62  ALKPHOS 130*  BILITOT 1.0   ------------------------------------------------------------------------------------------------------------------  Cardiac Enzymes No results for input(s): TROPONINI in the last 168 hours. ------------------------------------------------------------------------------------------------------------------  RADIOLOGY:  Ct Abdomen Pelvis W Contrast  Result Date: 11/28/2016 CLINICAL DATA:  Abdominal pain with anorexia for to 3 days. History of pancreatitis and suprapubic catheter. EXAM: CT ABDOMEN AND PELVIS WITH CONTRAST TECHNIQUE: Multidetector CT imaging of the abdomen and  pelvis was performed using the standard protocol following bolus administration of intravenous contrast. CONTRAST:  80mL ISOVUE-300 IOPAMIDOL (ISOVUE-300) INJECTION 61% COMPARISON:  08/05/2015 FINDINGS: Lower chest: No acute abnormality. Stable right lower lobe subpleural 5 mm ground-glass pulmonary nodule. Hepatobiliary: No focal liver abnormality is seen. No gallstones, gallbladder wall thickening, or intra hepatic biliary dilatation. There is a long segment of smooth narrowing of the distal extrahepatic common bile duct best seen on the coronal images, image 35/79, sequence 5. Pancreas: Coarse calcifications throughout the atrophic pancreatic parenchyma are suggestive of chronic pancreatitis. Mild peripancreatic fat stranding may be seen with element of acute pancreatitis. The main pancreatic duct is diffusely dilated. Spleen: Normal in size without focal abnormality. Adrenals/Urinary Tract: Normal appearance of the right adrenal gland. There are bilateral few mm nonobstructive renal calculi in the lower poles. Small 6 mm probable left renal cyst is seen in the lower pole as well. There is a mild left hydronephrosis. The left ureter is also hyperenhancing and dilated to the level of the distal 2/3 of the ureter. No radiopaque obstructing stone is seen within the ureter. There is a 1.9 by 2.1 cm left adrenal mass. The urinary bladder is poorly evaluated as it is collapsed around suprapubic urinary Foley. Stomach/Bowel: Stomach is within normal limits. No evidence of bowel wall distention or obstruction. There is diffuse smooth wall thickening of the rectum and parts of the descending colon. Vascular/Lymphatic: Aortic atherosclerosis. No enlarged abdominal or pelvic lymph nodes. Diffuse mesenteric stranding is seen. Reproductive: Prostate is unremarkable. Other: No abdominal wall hernia or  abnormality. No abdominopelvic ascites. Musculoskeletal: No acute or significant osseous findings. IMPRESSION: Findings of  chronic pancreatitis with mild peripancreatic fat stranding which suggests an element of acute pancreatitis. Long segment of narrowing of the distal extrahepatic common bile duct and diffuse dilation of the main pancreatic duct. These findings may be sequela of chronic compression from the heavily calcified pancreatic parenchyma, however a mass at the ampulla is difficult to exclude. Long segment of smooth mucosal thickening of the descending colon, and rectum. Findings may represent ischemic or infectious colitis. Malignancy although possible is favored less likely. Left hydronephrosis and proximal to mid hydroureter, without identifiable radiopaque ureteral calculus. The origin of left ureteral obstruction remains are known. Please note that the left ureter is not opacified on the delayed sequences and therefore is not well visualized. 2.1 cm left adrenal mass, stable from 2014. The long-term stability favors benign etiology. Electronically Signed   By: Ted Mcalpine M.D.   On: 11/28/2016 15:00    EKG:    IMPRESSION AND PLAN:   Gaspar Fowle  is a 50 y.o. male with a known history of Chronic pancreatitis alcohol-related quit drinking alcohol a few years ago, neurogenic bladder due to diabetes status post suprapubic catheter, uncontrolled diabetes on insulin, neuropathy, retinopathy comes to the emergency room having complaints of right upper quadrant and flank pain with 2 days of diarrhea  1. Sepsis source appears urine and colitis -Admit to medical floor -IV Zosyn. Pharmacy to dose -Full liquid diet -GI to see patient for abnormal CT scan which shows long segment of smooth mucosal thickening of the descending colon and rectum ischemic or infectious colitis -Follow WBC count and blood cultures  2. UTI due to indwelling suprapubic catheter -Follow up urine culture -On IV Zosyn  3. Uncontrolled diabetes We'll continue home dose Lantus and and sliding scale  4. Hypertension continue  home meds  5. DVT prophylaxis subcutaneous Lovenox  All the records are reviewed and case discussed with ED provider. Management plans discussed with the patient, family and they are in agreement.  CODE STATUS: full  TOTAL TIME TAKING CARE OF THIS PATIENT: .    Tongela Encinas M.D on 11/28/2016 at 3:37 PM  Between 7am to 6pm - Pager - [phone]  After 6pm go to [email or web address] - password [email or web address] [facility]  [place] [facility]  Office  [phone]  CC: Primary care physician; No PCP Per Patient

## 2016-11-28 NOTE — ED Triage Notes (Signed)
Pt c/o achy epigastric pain for the past 2 days .Marland Kitchen. Denies N/V/D..Marland Kitchen

## 2016-11-29 DIAGNOSIS — K860 Alcohol-induced chronic pancreatitis: Secondary | ICD-10-CM

## 2016-11-29 DIAGNOSIS — K529 Noninfective gastroenteritis and colitis, unspecified: Secondary | ICD-10-CM

## 2016-11-29 DIAGNOSIS — R1084 Generalized abdominal pain: Secondary | ICD-10-CM

## 2016-11-29 LAB — CBC
HCT: 36.3 % — ABNORMAL LOW (ref 40.0–52.0)
HEMOGLOBIN: 12.2 g/dL — AB (ref 13.0–18.0)
MCH: 31.3 pg (ref 26.0–34.0)
MCHC: 33.5 g/dL (ref 32.0–36.0)
MCV: 93.5 fL (ref 80.0–100.0)
PLATELETS: 158 10*3/uL (ref 150–440)
RBC: 3.88 MIL/uL — AB (ref 4.40–5.90)
RDW: 14.5 % (ref 11.5–14.5)
WBC: 15.4 10*3/uL — AB (ref 3.8–10.6)

## 2016-11-29 LAB — BLOOD CULTURE ID PANEL (REFLEXED)
Acinetobacter baumannii: NOT DETECTED
CANDIDA PARAPSILOSIS: NOT DETECTED
CANDIDA TROPICALIS: NOT DETECTED
CARBAPENEM RESISTANCE: NOT DETECTED
Candida albicans: NOT DETECTED
Candida glabrata: NOT DETECTED
Candida krusei: NOT DETECTED
ENTEROCOCCUS SPECIES: NOT DETECTED
Enterobacter cloacae complex: NOT DETECTED
Enterobacteriaceae species: DETECTED — AB
Escherichia coli: NOT DETECTED
Haemophilus influenzae: NOT DETECTED
KLEBSIELLA PNEUMONIAE: NOT DETECTED
Klebsiella oxytoca: NOT DETECTED
Listeria monocytogenes: NOT DETECTED
Methicillin resistance: NOT DETECTED
Neisseria meningitidis: NOT DETECTED
PROTEUS SPECIES: DETECTED — AB
Pseudomonas aeruginosa: NOT DETECTED
SERRATIA MARCESCENS: NOT DETECTED
STAPHYLOCOCCUS AUREUS BCID: NOT DETECTED
STAPHYLOCOCCUS SPECIES: NOT DETECTED
STREPTOCOCCUS PNEUMONIAE: NOT DETECTED
Streptococcus agalactiae: NOT DETECTED
Streptococcus pyogenes: NOT DETECTED
Streptococcus species: NOT DETECTED
VANCOMYCIN RESISTANCE: NOT DETECTED

## 2016-11-29 LAB — BASIC METABOLIC PANEL
ANION GAP: 5 (ref 5–15)
BUN: 24 mg/dL — ABNORMAL HIGH (ref 6–20)
CALCIUM: 7.6 mg/dL — AB (ref 8.9–10.3)
CO2: 24 mmol/L (ref 22–32)
CREATININE: 1.38 mg/dL — AB (ref 0.61–1.24)
Chloride: 106 mmol/L (ref 101–111)
GFR, EST NON AFRICAN AMERICAN: 57 mL/min — AB (ref >60)
Glucose, Bld: 102 mg/dL — ABNORMAL HIGH (ref 65–99)
Potassium: 3.4 mmol/L — ABNORMAL LOW (ref 3.5–5.1)
SODIUM: 135 mmol/L (ref 135–145)

## 2016-11-29 LAB — GLUCOSE, CAPILLARY
GLUCOSE-CAPILLARY: 93 mg/dL (ref 65–99)
GLUCOSE-CAPILLARY: 100 mg/dL — AB (ref 65–99)
Glucose-Capillary: 83 mg/dL (ref 65–99)
Glucose-Capillary: 92 mg/dL (ref 65–99)

## 2016-11-29 LAB — URINE CULTURE: Special Requests: NORMAL

## 2016-11-29 MED ORDER — MORPHINE SULFATE (PF) 4 MG/ML IV SOLN: 2.0000 mg | INTRAVENOUS | Status: DC | PRN

## 2016-11-29 MED ORDER — ONDANSETRON HCL 4 MG/2ML IJ SOLN
4.0000 mg | Freq: Four times a day (QID) | INTRAMUSCULAR | Status: DC | PRN
  Administered 2016-11-30 – 2016-12-01 (×2): 4 mg via INTRAVENOUS
  Filled 2016-11-29 (×2): qty 2

## 2016-11-29 MED ORDER — POTASSIUM CHLORIDE CRYS ER 20 MEQ PO TBCR
20.0000 meq | EXTENDED_RELEASE_TABLET | Freq: Two times a day (BID) | ORAL | Status: DC
  Administered 2016-11-29 – 2016-12-01 (×4): 20 meq via ORAL
  Filled 2016-11-29 (×4): qty 1

## 2016-11-29 MED ORDER — ACETAMINOPHEN 500 MG PO TABS: 500.0000 mg | ORAL_TABLET | Freq: Four times a day (QID) | ORAL | Status: DC | PRN

## 2016-11-29 NOTE — Progress Notes (Addendum)
[facility] - [place] at [facility]   PATIENT NAME: Timothy Clayton    MR#:  [id number]  DATE OF BIRTH:  July 26, 1966  SUBJECTIVE:  CHIEF COMPLAINT:   Chief Complaint  Patient presents with  . Abdominal Pain     Came with abdominal pain and found to have UTI, colitis and bacteremia now.   Today he is alert and oriented and says that pain is still there and asking for IV pain medications, but also asking to let him go home. After explaining him about importance of treatment and plan he agreed to comply with that and stay here.  REVIEW OF SYSTEMS:   Constitutional: Negative for chills, fever and weight loss.  HENT: Negative for ear discharge, ear pain and nosebleeds.   Eyes: Negative for blurred vision, pain and discharge.  Respiratory: Negative for sputum production, shortness of breath, wheezing and stridor.   Cardiovascular: Negative for chest pain, palpitations, orthopnea and PND.  Gastrointestinal: Positive for abdominal pain, diarrhea and nausea. Negative for vomiting.  Genitourinary: Negative for frequency and urgency.  Musculoskeletal: Negative for back pain and joint pain.  Neurological: Positive for weakness. Negative for sensory change, speech change and focal weakness.  Psychiatric/Behavioral: Negative for depression and hallucinations. The patient is not nervous/anxious.    ROS  DRUG ALLERGIES:  No Known Allergies  VITALS:  Blood pressure (!) 153/82, pulse 99, temperature (!) 100.6 F (38.1 C), temperature source Oral, resp. rate 20, height 5\' 8"  (1.727 m), weight 60.3 kg (133 lb), SpO2 100 %.  PHYSICAL EXAMINATION:  GENERAL:  50 y.o.-year-old patient lying in the bed with no acute distress.  EYES: Pupils equal, round, reactive to light and accommodation. No scleral icterus. Extraocular muscles intact.  HEENT: Head atraumatic, normocephalic. Oropharynx and nasopharynx clear.  NECK:  Supple, no jugular venous distention. No thyroid enlargement, no  tenderness.  LUNGS: Normal breath sounds bilaterally, no wheezing, rales,rhonchi or crepitation. No use of accessory muscles of respiration.  CARDIOVASCULAR: S1, S2 normal. No murmurs, rubs, or gallops.  ABDOMEN: Soft, mild tender, nondistended. Bowel sounds present. No organomegaly or mass. Supra pubic catheter in place. EXTREMITIES: No pedal edema, cyanosis, or clubbing.  NEUROLOGIC: Cranial nerves II through XII are intact. Muscle strength 5/5 in all extremities. Sensation intact. Gait not checked.  PSYCHIATRIC: The patient is alert and oriented x 3.  SKIN: No obvious rash, lesion, or ulcer.   Physical Exam LABORATORY PANEL:   CBC  Recent Labs Lab 11/29/16 0437  WBC 15.4*  HGB 12.2*  HCT 36.3*  PLT 158   ------------------------------------------------------------------------------------------------------------------  Chemistries   Recent Labs Lab 11/28/16 0941 11/29/16 0437  NA 136 135  K 3.7 3.4*  CL 103 106  CO2 25 24  GLUCOSE 271* 102*  BUN 25* 24*  CREATININE 1.53* 1.38*  CALCIUM 8.7* 7.6*  MG 1.6*  --   AST 53*  --   ALT 62  --   ALKPHOS 130*  --   BILITOT 1.0  --    ------------------------------------------------------------------------------------------------------------------  Cardiac Enzymes No results for input(s): TROPONINI in the last 168 hours. ------------------------------------------------------------------------------------------------------------------  RADIOLOGY:  Ct Abdomen Pelvis W Contrast  Result Date: 11/28/2016 CLINICAL DATA:  Abdominal pain with anorexia for to 3 days. History of pancreatitis and suprapubic catheter. EXAM: CT ABDOMEN AND PELVIS WITH CONTRAST TECHNIQUE: Multidetector CT imaging of the abdomen and pelvis was performed using the standard protocol following bolus administration of intravenous contrast. CONTRAST:  80mL ISOVUE-300 IOPAMIDOL (ISOVUE-300) INJECTION 61% COMPARISON:  08/05/2015 FINDINGS: Lower  chest: No acute  abnormality. Stable right lower lobe subpleural 5 mm ground-glass pulmonary nodule. Hepatobiliary: No focal liver abnormality is seen. No gallstones, gallbladder wall thickening, or intra hepatic biliary dilatation. There is a long segment of smooth narrowing of the distal extrahepatic common bile duct best seen on the coronal images, image 35/79, sequence 5. Pancreas: Coarse calcifications throughout the atrophic pancreatic parenchyma are suggestive of chronic pancreatitis. Mild peripancreatic fat stranding may be seen with element of acute pancreatitis. The main pancreatic duct is diffusely dilated. Spleen: Normal in size without focal abnormality. Adrenals/Urinary Tract: Normal appearance of the right adrenal gland. There are bilateral few mm nonobstructive renal calculi in the lower poles. Small 6 mm probable left renal cyst is seen in the lower pole as well. There is a mild left hydronephrosis. The left ureter is also hyperenhancing and dilated to the level of the distal 2/3 of the ureter. No radiopaque obstructing stone is seen within the ureter. There is a 1.9 by 2.1 cm left adrenal mass. The urinary bladder is poorly evaluated as it is collapsed around suprapubic urinary Foley. Stomach/Bowel: Stomach is within normal limits. No evidence of bowel wall distention or obstruction. There is diffuse smooth wall thickening of the rectum and parts of the descending colon. Vascular/Lymphatic: Aortic atherosclerosis. No enlarged abdominal or pelvic lymph nodes. Diffuse mesenteric stranding is seen. Reproductive: Prostate is unremarkable. Other: No abdominal wall hernia or abnormality. No abdominopelvic ascites. Musculoskeletal: No acute or significant osseous findings. IMPRESSION: Findings of chronic pancreatitis with mild peripancreatic fat stranding which suggests an element of acute pancreatitis. Long segment of narrowing of the distal extrahepatic common bile duct and diffuse dilation of the main pancreatic duct.  These findings may be sequela of chronic compression from the heavily calcified pancreatic parenchyma, however a mass at the ampulla is difficult to exclude. Long segment of smooth mucosal thickening of the descending colon, and rectum. Findings may represent ischemic or infectious colitis. Malignancy although possible is favored less likely. Left hydronephrosis and proximal to mid hydroureter, without identifiable radiopaque ureteral calculus. The origin of left ureteral obstruction remains are known. Please note that the left ureter is not opacified on the delayed sequences and therefore is not well visualized. 2.1 cm left adrenal mass, stable from 2014. The long-term stability favors benign etiology. Electronically Signed   By: Ted Mcalpineobrinka  Dimitrova M.D.   On: 11/28/2016 15:00    ASSESSMENT AND PLAN:   Active Problems:   Sepsis (HCC)   Colitis   Generalized abdominal pain   Alcohol-induced chronic pancreatitis (HCC)  Everlene FarrierGabriel Clowdus  is a 50 y.o. male with a known history of Chronic pancreatitis alcohol-related quit drinking alcohol a few years ago, neurogenic bladder due to diabetes status post suprapubic catheter, uncontrolled diabetes on insulin, neuropathy, retinopathy comes to the emergency room having complaints of right upper quadrant and flank pain with 2 days of diarrhea  1. Sepsis source appears urine and colitis - also have bacteremia -IV Zosyn. Pharmacy to dose -Full liquid diet- upgrade as he can tolerate. -GI to see patient for abnormal CT scan which shows long segment of smooth mucosal thickening of the descending colon and rectum ischemic or infectious colitis -Follow WBC count and blood cultures - ID consult to help due to bacteremia with proteus.  2. UTI due to indwelling suprapubic catheter -Follow up urine culture -On IV Zosyn  3. Uncontrolled diabetes We'll continue home dose Lantus and and sliding scale  4. Hypertension continue home meds  5. DVT prophylaxis  subcutaneous Lovenox  6. Smoking   Tobacco abuse councelling done for 4 min.  All the records are reviewed and case discussed with Care Management/Social Workerr. Management plans discussed with the patient, family and they are in agreement.  CODE STATUS: full.  TOTAL TIME TAKING CARE OF THIS PATIENT: 35 minutes.    POSSIBLE D/C IN 1-2 DAYS, DEPENDING ON CLINICAL CONDITION.   Altamese DillingVACHHANI, Lonney Revak M.D on 11/29/2016   Between 7am to 6pm - Pager - [phone]  After 6pm go to [email or web address] - Social research officer, governmentpassword [email or web address]  [facility] [place] [facility]  Office  [phone]  CC: Primary care physician; No PCP Per Patient  Note: This dictation was prepared with Dragon dictation along with smaller phrase technology. Any transcriptional errors that result from this process are unintentional.

## 2016-11-29 NOTE — Consult Note (Signed)
Midge Minium, MD [facility]  [address], [address] [place], [place] [address] Phone: [phone] Fax : [phone]  Consultation  Referring Provider:     No ref. provider found Primary Care Physician:  No PCP Per Patient Primary Gastroenterologist:  Dr. at [facility]         Reason for Consultation:     Abnormal CT scan  Date of Admission:  11/28/2016 Date of Consultation:  11/29/2016         HPI:   Timothy Clayton is a 50 y.o. male who has a history of chronic pancreatitis with an ERCP in the past and a colonoscopy 2 years ago. The patient's colonoscopy showed a 4 mm hyperplastic polyp. The patient had that done at [facility]. The patient was also supposed to have an EUS back in January of this year but did not go through with it because he had to cancel because of bad weather. The patient had reported abdominal pain and had a CT scan of the abdomen during this admission and was found to have a segment of his descending colon in rectum with smooth mucosal thickening suggestive of possible ischemia versus infectious colitis. The patient also denies any diarrhea despite an admission note reporting 2 days of diarrhea. The patient tells me that he has a bowel movement usually once a week and it is hard to pass stools. The patient reports that he quit drinking years ago and has a neurogenic bladder. For this she has a suprapubic catheter and grew out a positive blood culture for possible urinary infection. Patient was also admitted with a high white cell count. He reports that the abdominal pain is in the mid abdomen and he reports that he feels it is more consistent with his bladder. 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 I'm now being asked to see the patient for his abnormal CT scan of his colon.  Past Medical History:  Diagnosis Date  . Diabetes mellitus without complication (HCC)     . Pancreatitis   . Suprapubic catheter [facility])     History reviewed. No pertinent surgical history.  Prior to Admission medications   Medication Sig Start Date End Date Taking? Authorizing Provider  acetaminophen (TYLENOL) 500 MG tablet Take 500 mg by mouth every 6 (six) hours as needed for moderate pain.    Yes Historical Provider, MD  cilostazol (PLETAL) 100 MG tablet Take 100 mg by mouth 2 (two) times daily.   Yes Historical Provider, MD  insulin glargine (LANTUS) 100 UNIT/ML injection Inject 12 Units into the skin at bedtime.   Yes Historical Provider, MD  mirtazapine (REMERON) 15 MG tablet Take 15 mg by mouth at bedtime.   Yes Historical Provider, MD  omeprazole (PRILOSEC) 20 MG capsule Take 20 mg by mouth daily.   Yes Historical Provider, MD  OXcarbazepine (TRILEPTAL) 150 MG tablet Take 150 mg by mouth at bedtime.   Yes Historical Provider, MD  oxyCODONE-acetaminophen (PERCOCET) 7.5-325 MG tablet Take 1 tablet by mouth every 8 (eight) hours as needed for severe pain.    Yes Historical Provider, MD  Pancrelipase, Lip-Prot-Amyl, 6000 units CPEP Take 6,000 Units by mouth 3 (three) times daily with meals.   Yes Historical Provider, MD  Vitamin D, Ergocalciferol, (DRISDOL) 50000 units CAPS capsule Take 50,000 Units by mouth 2 (two) times a week. On Tuesday and Thursday   Yes Historical Provider, MD  No family history on file.   Social History  Substance Use Topics  . Smoking status: Current Every Day Smoker  . Smokeless tobacco: Never Used  . Alcohol use No    Allergies as of 11/28/2016  . (No Known Allergies)    Review of Systems:    All systems reviewed and negative except where noted in HPI.   Physical Exam:  Vital signs in last 24 hours: Temp:  [99.8 F (37.7 C)-102.9 F (39.4 C)] 100.6 F (38.1 C) (12/11 1233) Pulse Rate:  [99-105] 99 (12/11 1233) Resp:  [20-22] 20 (12/11 0551) BP: (135-153)/(73-82) 153/82 (12/11 1233) SpO2:  [97 %-100 %] 100 % (12/11 1233) Last  BM Date: 11/28/16 General:   Pleasant, cooperative in NAD Head:  Normocephalic and atraumatic. Eyes:   No icterus.   Conjunctiva pink. PERRLA. Ears:  Normal auditory acuity. Neck:  Supple; no masses or thyroidomegaly Lungs: Respirations even and unlabored. Lungs clear to auscultation bilaterally.   No wheezes, crackles, or rhonchi.  Heart:  Regular rate and rhythm;  Without murmur, clicks, rubs or gallops Abdomen:  Soft, nondistended, Positive diffuse tenderness. Normal bowel sounds. No appreciable masses or hepatomegaly.  No rebound or guarding.  Rectal:  Not performed. Msk:  Symmetrical without gross deformities.    Extremities:  Without edema, cyanosis or clubbing. Neurologic:  Alert and oriented x3;  grossly normal neurologically. Skin:  Intact without significant lesions or rashes. Cervical Nodes:  No significant cervical adenopathy. Psych:  Alert and cooperative. Normal affect.  LAB RESULTS:  Recent Labs  11/28/16 0941 11/29/16 0437  WBC 21.3* 15.4*  HGB 14.6 12.2*  HCT 43.1 36.3*  PLT 200 158   BMET  Recent Labs  11/28/16 0941 11/29/16 0437  NA 136 135  K 3.7 3.4*  CL 103 106  CO2 25 24  GLUCOSE 271* 102*  BUN 25* 24*  CREATININE 1.53* 1.38*  CALCIUM 8.7* 7.6*   LFT  Recent Labs  11/28/16 0941  PROT 7.0  ALBUMIN 3.5  AST 53*  ALT 62  ALKPHOS 130*  BILITOT 1.0   PT/INR No results for input(s): LABPROT, INR in the last 72 hours.  STUDIES: Ct Abdomen Pelvis W Contrast  Result Date: 11/28/2016 CLINICAL DATA:  Abdominal pain with anorexia for to 3 days. History of pancreatitis and suprapubic catheter. EXAM: CT ABDOMEN AND PELVIS WITH CONTRAST TECHNIQUE: Multidetector CT imaging of the abdomen and pelvis was performed using the standard protocol following bolus administration of intravenous contrast. CONTRAST:  80mL ISOVUE-300 IOPAMIDOL (ISOVUE-300) INJECTION 61% COMPARISON:  08/05/2015 FINDINGS: Lower chest: No acute abnormality. Stable right lower lobe  subpleural 5 mm ground-glass pulmonary nodule. Hepatobiliary: No focal liver abnormality is seen. No gallstones, gallbladder wall thickening, or intra hepatic biliary dilatation. There is a long segment of smooth narrowing of the distal extrahepatic common bile duct best seen on the coronal images, image 35/79, sequence 5. Pancreas: Coarse calcifications throughout the atrophic pancreatic parenchyma are suggestive of chronic pancreatitis. Mild peripancreatic fat stranding may be seen with element of acute pancreatitis. The main pancreatic duct is diffusely dilated. Spleen: Normal in size without focal abnormality. Adrenals/Urinary Tract: Normal appearance of the right adrenal gland. There are bilateral few mm nonobstructive renal calculi in the lower poles. Small 6 mm probable left renal cyst is seen in the lower pole as well. There is a mild left hydronephrosis. The left ureter is also hyperenhancing and dilated to the level of the distal 2/3 of the ureter. No radiopaque obstructing stone is  seen within the ureter. There is a 1.9 by 2.1 cm left adrenal mass. The urinary bladder is poorly evaluated as it is collapsed around suprapubic urinary Foley. Stomach/Bowel: Stomach is within normal limits. No evidence of bowel wall distention or obstruction. There is diffuse smooth wall thickening of the rectum and parts of the descending colon. Vascular/Lymphatic: Aortic atherosclerosis. No enlarged abdominal or pelvic lymph nodes. Diffuse mesenteric stranding is seen. Reproductive: Prostate is unremarkable. Other: No abdominal wall hernia or abnormality. No abdominopelvic ascites. Musculoskeletal: No acute or significant osseous findings. IMPRESSION: Findings of chronic pancreatitis with mild peripancreatic fat stranding which suggests an element of acute pancreatitis. Long segment of narrowing of the distal extrahepatic common bile duct and diffuse dilation of the main pancreatic duct. These findings may be sequela of  chronic compression from the heavily calcified pancreatic parenchyma, however a mass at the ampulla is difficult to exclude. Long segment of smooth mucosal thickening of the descending colon, and rectum. Findings may represent ischemic or infectious colitis. Malignancy although possible is favored less likely. Left hydronephrosis and proximal to mid hydroureter, without identifiable radiopaque ureteral calculus. The origin of left ureteral obstruction remains are known. Please note that the left ureter is not opacified on the delayed sequences and therefore is not well visualized. 2.1 cm left adrenal mass, stable from 2014. The long-term stability favors benign etiology. Electronically Signed   By: Ted Mcalpineobrinka  Dimitrova M.D.   On: 11/28/2016 15:00      Impression / Plan:   Timothy Clayton is a 50 y.o. y/o male with an admission for sepsis and was found on CT scan to have a thickened left colon . The patient had a colonoscopy 2 years ago without any findings to suggest malignancy. The patient also reports that he is not having any diarrhea. The patient's main problem at the present time is his positive Proteus and should be treated for that. The patient is being followed by ID. If his abdominal pain persists after treatment for his infection or he should develop diarrhea then further workup with a repeat colonoscopy may be considered. The patient has been explained the plan and agrees with it.  Thank you for involving me in the care of this patient.      LOS: 1 day   Midge Miniumarren Zhara Gieske, MD  11/29/2016, 5:22 PM   Note: This dictation was prepared with Dragon dictation along with smaller phrase technology. Any transcriptional errors that result from this process are unintentional.

## 2016-11-29 NOTE — Consult Note (Signed)
[facility]     Reason for Consult: Sepsis   Referring Physician: Fritzi Mandes Date of Admission:  11/28/2016   Active Problems:   Sepsis [facility])   HPI: Timothy Clayton is a 50 y.o. male admitted with abd pain, flank and found to have proteus bacteremia. He has a hx of chronic pancreatitis, alcohol-related quit drinking alcohol a few years ago, neurogenic bladder due to diabetes status post suprapubic catheter, uncontrolled diabetes on insulin, neuropathy, retinopathy  On admit wbc was 21 and temp 103.  UA had TNTC WBC and clumps. He had CT done showing some colon inflammation but he denies much in the way of recent diarrhea. He reports recurrent UTIs due to [facility] cath- has it changed monthly at [facility].   Past Medical History:  Diagnosis Date  . Diabetes mellitus without complication ([place])   . Pancreatitis   . Suprapubic catheter [facility])    History reviewed. No pertinent surgical history. Social History  Substance Use Topics  . Smoking status: Current Every Day Smoker  . Smokeless tobacco: Never Used  . Alcohol use No   No family history on file.  Allergies: No Known Allergies  Current antibiotics: Antibiotics Given (last 72 hours)    Date/Time Action Medication Dose Rate   11/28/16 1742 Given   piperacillin-tazobactam (ZOSYN) IVPB 3.375 g 3.375 g 12.5 mL/hr   11/29/16 0531 Given   piperacillin-tazobactam (ZOSYN) IVPB 3.375 g 3.375 g 12.5 mL/hr   11/29/16 1354 Given   piperacillin-tazobactam (ZOSYN) IVPB 3.375 g 3.375 g 12.5 mL/hr      MEDICATIONS: . cilostazol  100 mg Oral BID  . enoxaparin (LOVENOX) injection  40 mg Subcutaneous Q24H  . insulin aspart  0-5 Units Subcutaneous QHS  . insulin aspart  0-9 Units Subcutaneous TID WC  . insulin glargine  12 Units Subcutaneous QHS  . lipase/protease/amylase  12,000 Units Oral TID WC  . mirtazapine  15 mg Oral QHS  . nicotine  21 mg Transdermal Daily  . OXcarbazepine  150 mg Oral QHS  . pantoprazole  40 mg  Oral Daily  . piperacillin-tazobactam (ZOSYN)  IV  3.375 g Intravenous Q8H  . Vitamin D (Ergocalciferol)  50,000 Units Oral Once per day on Mon Thu    Review of Systems - 11 systems reviewed and negative per HPI  Objective: Vital signs in last 24 hours: Temp:  [99.8 F (37.7 C)-102.9 F (39.4 C)] 100.3 F (37.9 C) (12/11 1835) Pulse Rate:  [99-105] 99 (12/11 1233) Resp:  [20-22] 20 (12/11 0551) BP: (135-153)/(73-82) 153/82 (12/11 1233) SpO2:  [97 %-100 %] 100 % (12/11 1233) Physical Exam  Constitutional: He is oriented to person, place, and time. He appears thin and chronically ill appearing.  HENT: anicteric, muddy sclera Mouth/Throat: Oropharynx is clear and moist. No oropharyngeal exudate.  Cardiovascular: Normal rate, regular rhythm and normal heart sounds. Pulmonary/Chest: Effort normal and breath sounds normal. No respiratory distress. He has no wheezes.  Abdominal: Soft. Bowel sounds are normal. He exhibits no distension. There is no tenderness.  SP cath in place with no drainage   Lymphadenopathy: He has no cervical adenopathy.  Neurological: He is alert and oriented to person, place, and time.  Skin: Skin is warm and dry. No rash noted. No erythema.  Psychiatric: He has a normal mood and affect. His behavior is normal.   Lab Results  Recent Labs  11/28/16 0941 11/29/16 0437  WBC 21.3* 15.4*  HGB 14.6 12.2*  HCT 43.1 36.3*  NA 136  135  K 3.7 3.4*  CL 103 106  CO2 25 24  BUN 25* 24*  CREATININE 1.53* 1.38*    Microbiology: Results for orders placed or performed during the hospital encounter of 11/28/16  Urine culture     Status: Abnormal   Collection Time: 11/28/16 11:15 AM  Result Value Ref Range Status   Specimen Description URINE, RANDOM  Final   Special Requests Normal  Final   Culture MULTIPLE SPECIES PRESENT, SUGGEST RECOLLECTION (A)  Final   Report Status 11/29/2016 FINAL  Final  Culture, blood (Routine X 2) w Reflex to ID Panel     Status: None  (Preliminary result)   Collection Time: 11/28/16 11:22 AM  Result Value Ref Range Status   Specimen Description BLOOD LEFT ARM  Final   Special Requests   Final    BOTTLES DRAWN AEROBIC AND ANAEROBIC AER 9ML ANA 10ML   Culture  Setup Time   Final    Organism ID to follow GRAM NEGATIVE RODS IN BOTH AEROBIC AND ANAEROBIC BOTTLES CRITICAL RESULT CALLED TO, READ BACK BY AND VERIFIED WITH: HANK ZOMPA 11/29/16 0739 SGD    Culture GRAM NEGATIVE RODS  Final   Report Status PENDING  Incomplete  Culture, blood (Routine X 2) w Reflex to ID Panel     Status: None (Preliminary result)   Collection Time: 11/28/16 11:22 AM  Result Value Ref Range Status   Specimen Description BLOOD RIGHT ARM  Final   Special Requests   Final    BOTTLES DRAWN AEROBIC AND ANAEROBIC ANA 14ML AER 9 ML   Culture  Setup Time PENDING  Incomplete   Culture NO GROWTH < 24 HOURS  Final   Report Status PENDING  Incomplete  Blood Culture ID Panel (Reflexed)     Status: Abnormal   Collection Time: 11/28/16 11:22 AM  Result Value Ref Range Status   Enterococcus species NOT DETECTED NOT DETECTED Final   Vancomycin resistance NOT DETECTED NOT DETECTED Final   Listeria monocytogenes NOT DETECTED NOT DETECTED Final   Staphylococcus species NOT DETECTED NOT DETECTED Final   Staphylococcus aureus NOT DETECTED NOT DETECTED Final   Methicillin resistance NOT DETECTED NOT DETECTED Final   Streptococcus species NOT DETECTED NOT DETECTED Final   Streptococcus agalactiae NOT DETECTED NOT DETECTED Final   Streptococcus pneumoniae NOT DETECTED NOT DETECTED Final   Streptococcus pyogenes NOT DETECTED NOT DETECTED Final   Acinetobacter baumannii NOT DETECTED NOT DETECTED Final   Enterobacteriaceae species DETECTED (A) NOT DETECTED Final    Comment: CRITICAL RESULT CALLED TO, READ BACK BY AND VERIFIED WITH: HANK ZOMPA 11/29/16 0739 SGD    Enterobacter cloacae complex NOT DETECTED NOT DETECTED Final   Escherichia coli NOT DETECTED NOT  DETECTED Final   Klebsiella oxytoca NOT DETECTED NOT DETECTED Final   Klebsiella pneumoniae NOT DETECTED NOT DETECTED Final   Proteus species DETECTED (A) NOT DETECTED Final    Comment: CRITICAL RESULT CALLED TO, READ BACK BY AND VERIFIED WITH: HANK ZOMPA 11/29/16 0739 SGD    Serratia marcescens NOT DETECTED NOT DETECTED Final   Carbapenem resistance NOT DETECTED NOT DETECTED Final   Haemophilus influenzae NOT DETECTED NOT DETECTED Final   Neisseria meningitidis NOT DETECTED NOT DETECTED Final   Pseudomonas aeruginosa NOT DETECTED NOT DETECTED Final   Candida albicans NOT DETECTED NOT DETECTED Final   Candida glabrata NOT DETECTED NOT DETECTED Final   Candida krusei NOT DETECTED NOT DETECTED Final   Candida parapsilosis NOT DETECTED NOT DETECTED Final  Candida tropicalis NOT DETECTED NOT DETECTED Final  MRSA PCR Screening     Status: None   Collection Time: 11/28/16  5:36 PM  Result Value Ref Range Status   MRSA by PCR NEGATIVE NEGATIVE Final    Comment:        The GeneXpert MRSA Assay (FDA approved for NASAL specimens only), is one component of a comprehensive MRSA colonization surveillance program. It is not intended to diagnose MRSA infection nor to guide or monitor treatment for MRSA infections.     Studies/Results: Ct Abdomen Pelvis W Contrast  Result Date: 11/28/2016 CLINICAL DATA:  Abdominal pain with anorexia for to 3 days. History of pancreatitis and suprapubic catheter. EXAM: CT ABDOMEN AND PELVIS WITH CONTRAST TECHNIQUE: Multidetector CT imaging of the abdomen and pelvis was performed using the standard protocol following bolus administration of intravenous contrast. CONTRAST:  56m ISOVUE-300 IOPAMIDOL (ISOVUE-300) INJECTION 61% COMPARISON:  08/05/2015 FINDINGS: Lower chest: No acute abnormality. Stable right lower lobe subpleural 5 mm ground-glass pulmonary nodule. Hepatobiliary: No focal liver abnormality is seen. No gallstones, gallbladder wall thickening, or  intra hepatic biliary dilatation. There is a long segment of smooth narrowing of the distal extrahepatic common bile duct best seen on the coronal images, image 35/79, sequence 5. Pancreas: Coarse calcifications throughout the atrophic pancreatic parenchyma are suggestive of chronic pancreatitis. Mild peripancreatic fat stranding may be seen with element of acute pancreatitis. The main pancreatic duct is diffusely dilated. Spleen: Normal in size without focal abnormality. Adrenals/Urinary Tract: Normal appearance of the right adrenal gland. There are bilateral few mm nonobstructive renal calculi in the lower poles. Small 6 mm probable left renal cyst is seen in the lower pole as well. There is a mild left hydronephrosis. The left ureter is also hyperenhancing and dilated to the level of the distal 2/3 of the ureter. No radiopaque obstructing stone is seen within the ureter. There is a 1.9 by 2.1 cm left adrenal mass. The urinary bladder is poorly evaluated as it is collapsed around suprapubic urinary Foley. Stomach/Bowel: Stomach is within normal limits. No evidence of bowel wall distention or obstruction. There is diffuse smooth wall thickening of the rectum and parts of the descending colon. Vascular/Lymphatic: Aortic atherosclerosis. No enlarged abdominal or pelvic lymph nodes. Diffuse mesenteric stranding is seen. Reproductive: Prostate is unremarkable. Other: No abdominal wall hernia or abnormality. No abdominopelvic ascites. Musculoskeletal: No acute or significant osseous findings. IMPRESSION: Findings of chronic pancreatitis with mild peripancreatic fat stranding which suggests an element of acute pancreatitis. Long segment of narrowing of the distal extrahepatic common bile duct and diffuse dilation of the main pancreatic duct. These findings may be sequela of chronic compression from the heavily calcified pancreatic parenchyma, however a mass at the ampulla is difficult to exclude. Long segment of smooth  mucosal thickening of the descending colon, and rectum. Findings may represent ischemic or infectious colitis. Malignancy although possible is favored less likely. Left hydronephrosis and proximal to mid hydroureter, without identifiable radiopaque ureteral calculus. The origin of left ureteral obstruction remains are known. Please note that the left ureter is not opacified on the delayed sequences and therefore is not well visualized. 2.1 cm left adrenal mass, stable from 2014. The long-term stability favors benign etiology. Electronically Signed   By: DFidela SalisburyM.D.   On: 11/28/2016 15:00   LABS: Results for orders placed or performed during the hospital encounter of 11/28/16 (from the past 48 hour(s))  Lipase, blood     Status: None   Collection  Time: 11/28/16  9:41 AM  Result Value Ref Range   Lipase 12 11 - 51 U/L  Comprehensive metabolic panel     Status: Abnormal   Collection Time: 11/28/16  9:41 AM  Result Value Ref Range   Sodium 136 135 - 145 mmol/L   Potassium 3.7 3.5 - 5.1 mmol/L   Chloride 103 101 - 111 mmol/L   CO2 25 22 - 32 mmol/L   Glucose, Bld 271 (H) 65 - 99 mg/dL   BUN 25 (H) 6 - 20 mg/dL   Creatinine, Ser 1.53 (H) 0.61 - 1.24 mg/dL   Calcium 8.7 (L) 8.9 - 10.3 mg/dL   Total Protein 7.0 6.5 - 8.1 g/dL   Albumin 3.5 3.5 - 5.0 g/dL   AST 53 (H) 15 - 41 U/L   ALT 62 17 - 63 U/L   Alkaline Phosphatase 130 (H) 38 - 126 U/L   Total Bilirubin 1.0 0.3 - 1.2 mg/dL   GFR calc non Af Amer 51 (L) >60 mL/min   GFR calc Af Amer 59 (L) >60 mL/min    Comment: (NOTE) The eGFR has been calculated using the CKD EPI equation. This calculation has not been validated in all clinical situations. eGFR's persistently <60 mL/min signify possible Chronic Kidney Disease.    Anion gap 8 5 - 15  CBC     Status: Abnormal   Collection Time: 11/28/16  9:41 AM  Result Value Ref Range   WBC 21.3 (H) 3.8 - 10.6 K/uL   RBC 4.67 4.40 - 5.90 MIL/uL   Hemoglobin 14.6 13.0 - 18.0 g/dL    HCT 43.1 40.0 - 52.0 %   MCV 92.3 80.0 - 100.0 fL   MCH 31.2 26.0 - 34.0 pg   MCHC 33.8 32.0 - 36.0 g/dL   RDW 14.3 11.5 - 14.5 %   Platelets 200 150 - 440 K/uL  Magnesium     Status: Abnormal   Collection Time: 11/28/16  9:41 AM  Result Value Ref Range   Magnesium 1.6 (L) 1.7 - 2.4 mg/dL  Urinalysis, Complete w Microscopic     Status: Abnormal   Collection Time: 11/28/16 11:15 AM  Result Value Ref Range   Color, Urine YELLOW (A) YELLOW   APPearance TURBID (A) CLEAR   Specific Gravity, Urine 1.014 1.005 - 1.030   pH 7.0 5.0 - 8.0   Glucose, UA >=500 (A) NEGATIVE mg/dL   Hgb urine dipstick LARGE (A) NEGATIVE   Bilirubin Urine NEGATIVE NEGATIVE   Ketones, ur NEGATIVE NEGATIVE mg/dL   Protein, ur 100 (A) NEGATIVE mg/dL   Nitrite NEGATIVE NEGATIVE   Leukocytes, UA LARGE (A) NEGATIVE   RBC / HPF TOO NUMEROUS TO COUNT 0 - 5 RBC/hpf   WBC, UA TOO NUMEROUS TO COUNT 0 - 5 WBC/hpf   Bacteria, UA MANY (A) NONE SEEN   Squamous Epithelial / LPF NONE SEEN NONE SEEN   WBC Clumps PRESENT   Urine culture     Status: Abnormal   Collection Time: 11/28/16 11:15 AM  Result Value Ref Range   Specimen Description URINE, RANDOM    Special Requests Normal    Culture MULTIPLE SPECIES PRESENT, SUGGEST RECOLLECTION (A)    Report Status 11/29/2016 FINAL   Lactic acid, plasma     Status: None   Collection Time: 11/28/16 11:22 AM  Result Value Ref Range   Lactic Acid, Venous 1.5 0.5 - 1.9 mmol/L  Culture, blood (Routine X 2) w Reflex to ID Panel  Status: None (Preliminary result)   Collection Time: 11/28/16 11:22 AM  Result Value Ref Range   Specimen Description BLOOD LEFT ARM    Special Requests      BOTTLES DRAWN AEROBIC AND ANAEROBIC AER 9ML ANA 10ML   Culture  Setup Time      Organism ID to follow GRAM NEGATIVE RODS IN BOTH AEROBIC AND ANAEROBIC BOTTLES CRITICAL RESULT CALLED TO, READ BACK BY AND VERIFIED WITH: HANK ZOMPA 11/29/16 0739 SGD    Culture GRAM NEGATIVE RODS    Report Status  PENDING   Culture, blood (Routine X 2) w Reflex to ID Panel     Status: None (Preliminary result)   Collection Time: 11/28/16 11:22 AM  Result Value Ref Range   Specimen Description BLOOD RIGHT ARM    Special Requests      BOTTLES DRAWN AEROBIC AND ANAEROBIC ANA 14ML AER 9 ML   Culture  Setup Time PENDING    Culture NO GROWTH < 24 HOURS    Report Status PENDING   Blood Culture ID Panel (Reflexed)     Status: Abnormal   Collection Time: 11/28/16 11:22 AM  Result Value Ref Range   Enterococcus species NOT DETECTED NOT DETECTED   Vancomycin resistance NOT DETECTED NOT DETECTED   Listeria monocytogenes NOT DETECTED NOT DETECTED   Staphylococcus species NOT DETECTED NOT DETECTED   Staphylococcus aureus NOT DETECTED NOT DETECTED   Methicillin resistance NOT DETECTED NOT DETECTED   Streptococcus species NOT DETECTED NOT DETECTED   Streptococcus agalactiae NOT DETECTED NOT DETECTED   Streptococcus pneumoniae NOT DETECTED NOT DETECTED   Streptococcus pyogenes NOT DETECTED NOT DETECTED   Acinetobacter baumannii NOT DETECTED NOT DETECTED   Enterobacteriaceae species DETECTED (A) NOT DETECTED    Comment: CRITICAL RESULT CALLED TO, READ BACK BY AND VERIFIED WITH: HANK ZOMPA 11/29/16 0739 SGD    Enterobacter cloacae complex NOT DETECTED NOT DETECTED   Escherichia coli NOT DETECTED NOT DETECTED   Klebsiella oxytoca NOT DETECTED NOT DETECTED   Klebsiella pneumoniae NOT DETECTED NOT DETECTED   Proteus species DETECTED (A) NOT DETECTED    Comment: CRITICAL RESULT CALLED TO, READ BACK BY AND VERIFIED WITH: HANK ZOMPA 11/29/16 0739 SGD    Serratia marcescens NOT DETECTED NOT DETECTED   Carbapenem resistance NOT DETECTED NOT DETECTED   Haemophilus influenzae NOT DETECTED NOT DETECTED   Neisseria meningitidis NOT DETECTED NOT DETECTED   Pseudomonas aeruginosa NOT DETECTED NOT DETECTED   Candida albicans NOT DETECTED NOT DETECTED   Candida glabrata NOT DETECTED NOT DETECTED   Candida krusei NOT  DETECTED NOT DETECTED   Candida parapsilosis NOT DETECTED NOT DETECTED   Candida tropicalis NOT DETECTED NOT DETECTED  MRSA PCR Screening     Status: None   Collection Time: 11/28/16  5:36 PM  Result Value Ref Range   MRSA by PCR NEGATIVE NEGATIVE    Comment:        The GeneXpert MRSA Assay (FDA approved for NASAL specimens only), is one component of a comprehensive MRSA colonization surveillance program. It is not intended to diagnose MRSA infection nor to guide or monitor treatment for MRSA infections.   Glucose, capillary     Status: Abnormal   Collection Time: 11/28/16  5:40 PM  Result Value Ref Range   Glucose-Capillary 260 (H) 65 - 99 mg/dL   Comment 1 Notify RN   Glucose, capillary     Status: Abnormal   Collection Time: 11/28/16  9:36 PM  Result Value Ref Range  Glucose-Capillary 124 (H) 65 - 99 mg/dL  Basic metabolic panel     Status: Abnormal   Collection Time: 11/29/16  4:37 AM  Result Value Ref Range   Sodium 135 135 - 145 mmol/L   Potassium 3.4 (L) 3.5 - 5.1 mmol/L   Chloride 106 101 - 111 mmol/L   CO2 24 22 - 32 mmol/L   Glucose, Bld 102 (H) 65 - 99 mg/dL   BUN 24 (H) 6 - 20 mg/dL   Creatinine, Ser 1.38 (H) 0.61 - 1.24 mg/dL   Calcium 7.6 (L) 8.9 - 10.3 mg/dL   GFR calc non Af Amer 57 (L) >60 mL/min   GFR calc Af Amer >60 >60 mL/min    Comment: (NOTE) The eGFR has been calculated using the CKD EPI equation. This calculation has not been validated in all clinical situations. eGFR's persistently <60 mL/min signify possible Chronic Kidney Disease.    Anion gap 5 5 - 15  CBC     Status: Abnormal   Collection Time: 11/29/16  4:37 AM  Result Value Ref Range   WBC 15.4 (H) 3.8 - 10.6 K/uL   RBC 3.88 (L) 4.40 - 5.90 MIL/uL   Hemoglobin 12.2 (L) 13.0 - 18.0 g/dL   HCT 36.3 (L) 40.0 - 52.0 %   MCV 93.5 80.0 - 100.0 fL   MCH 31.3 26.0 - 34.0 pg   MCHC 33.5 32.0 - 36.0 g/dL   RDW 14.5 11.5 - 14.5 %   Platelets 158 150 - 440 K/uL  Glucose, capillary      Status: None   Collection Time: 11/29/16  7:56 AM  Result Value Ref Range   Glucose-Capillary 83 65 - 99 mg/dL   Comment 1 Notify RN   Glucose, capillary     Status: None   Collection Time: 11/29/16 11:40 AM  Result Value Ref Range   Glucose-Capillary 93 65 - 99 mg/dL   No components found for: ESR, C REACTIVE PROTEIN MICRO: Recent Results (from the past 720 hour(s))  Urine culture     Status: Abnormal   Collection Time: 11/28/16 11:15 AM  Result Value Ref Range Status   Specimen Description URINE, RANDOM  Final   Special Requests Normal  Final   Culture MULTIPLE SPECIES PRESENT, SUGGEST RECOLLECTION (A)  Final   Report Status 11/29/2016 FINAL  Final  Culture, blood (Routine X 2) w Reflex to ID Panel     Status: None (Preliminary result)   Collection Time: 11/28/16 11:22 AM  Result Value Ref Range Status   Specimen Description BLOOD LEFT ARM  Final   Special Requests   Final    BOTTLES DRAWN AEROBIC AND ANAEROBIC AER 9ML ANA 10ML   Culture  Setup Time   Final    Organism ID to follow GRAM NEGATIVE RODS IN BOTH AEROBIC AND ANAEROBIC BOTTLES CRITICAL RESULT CALLED TO, READ BACK BY AND VERIFIED WITH: HANK ZOMPA 11/29/16 0739 SGD    Culture GRAM NEGATIVE RODS  Final   Report Status PENDING  Incomplete  Culture, blood (Routine X 2) w Reflex to ID Panel     Status: None (Preliminary result)   Collection Time: 11/28/16 11:22 AM  Result Value Ref Range Status   Specimen Description BLOOD RIGHT ARM  Final   Special Requests   Final    BOTTLES DRAWN AEROBIC AND ANAEROBIC ANA 14ML AER 9 ML   Culture  Setup Time PENDING  Incomplete   Culture NO GROWTH < 24 HOURS  Final  Report Status PENDING  Incomplete  Blood Culture ID Panel (Reflexed)     Status: Abnormal   Collection Time: 11/28/16 11:22 AM  Result Value Ref Range Status   Enterococcus species NOT DETECTED NOT DETECTED Final   Vancomycin resistance NOT DETECTED NOT DETECTED Final   Listeria monocytogenes NOT DETECTED NOT  DETECTED Final   Staphylococcus species NOT DETECTED NOT DETECTED Final   Staphylococcus aureus NOT DETECTED NOT DETECTED Final   Methicillin resistance NOT DETECTED NOT DETECTED Final   Streptococcus species NOT DETECTED NOT DETECTED Final   Streptococcus agalactiae NOT DETECTED NOT DETECTED Final   Streptococcus pneumoniae NOT DETECTED NOT DETECTED Final   Streptococcus pyogenes NOT DETECTED NOT DETECTED Final   Acinetobacter baumannii NOT DETECTED NOT DETECTED Final   Enterobacteriaceae species DETECTED (A) NOT DETECTED Final    Comment: CRITICAL RESULT CALLED TO, READ BACK BY AND VERIFIED WITH: HANK ZOMPA 11/29/16 0739 SGD    Enterobacter cloacae complex NOT DETECTED NOT DETECTED Final   Escherichia coli NOT DETECTED NOT DETECTED Final   Klebsiella oxytoca NOT DETECTED NOT DETECTED Final   Klebsiella pneumoniae NOT DETECTED NOT DETECTED Final   Proteus species DETECTED (A) NOT DETECTED Final    Comment: CRITICAL RESULT CALLED TO, READ BACK BY AND VERIFIED WITH: HANK ZOMPA 11/29/16 0739 SGD    Serratia marcescens NOT DETECTED NOT DETECTED Final   Carbapenem resistance NOT DETECTED NOT DETECTED Final   Haemophilus influenzae NOT DETECTED NOT DETECTED Final   Neisseria meningitidis NOT DETECTED NOT DETECTED Final   Pseudomonas aeruginosa NOT DETECTED NOT DETECTED Final   Candida albicans NOT DETECTED NOT DETECTED Final   Candida glabrata NOT DETECTED NOT DETECTED Final   Candida krusei NOT DETECTED NOT DETECTED Final   Candida parapsilosis NOT DETECTED NOT DETECTED Final   Candida tropicalis NOT DETECTED NOT DETECTED Final  MRSA PCR Screening     Status: None   Collection Time: 11/28/16  5:36 PM  Result Value Ref Range Status   MRSA by PCR NEGATIVE NEGATIVE Final    Comment:        The GeneXpert MRSA Assay (FDA approved for NASAL specimens only), is one component of a comprehensive MRSA colonization surveillance program. It is not intended to diagnose MRSA infection nor to  guide or monitor treatment for MRSA infections.     IMAGING: Ct Abdomen Pelvis W Contrast  Result Date: 11/28/2016 CLINICAL DATA:  Abdominal pain with anorexia for to 3 days. History of pancreatitis and suprapubic catheter. EXAM: CT ABDOMEN AND PELVIS WITH CONTRAST TECHNIQUE: Multidetector CT imaging of the abdomen and pelvis was performed using the standard protocol following bolus administration of intravenous contrast. CONTRAST:  3m ISOVUE-300 IOPAMIDOL (ISOVUE-300) INJECTION 61% COMPARISON:  08/05/2015 FINDINGS: Lower chest: No acute abnormality. Stable right lower lobe subpleural 5 mm ground-glass pulmonary nodule. Hepatobiliary: No focal liver abnormality is seen. No gallstones, gallbladder wall thickening, or intra hepatic biliary dilatation. There is a long segment of smooth narrowing of the distal extrahepatic common bile duct best seen on the coronal images, image 35/79, sequence 5. Pancreas: Coarse calcifications throughout the atrophic pancreatic parenchyma are suggestive of chronic pancreatitis. Mild peripancreatic fat stranding may be seen with element of acute pancreatitis. The main pancreatic duct is diffusely dilated. Spleen: Normal in size without focal abnormality. Adrenals/Urinary Tract: Normal appearance of the right adrenal gland. There are bilateral few mm nonobstructive renal calculi in the lower poles. Small 6 mm probable left renal cyst is seen in the lower pole as well. There  is a mild left hydronephrosis. The left ureter is also hyperenhancing and dilated to the level of the distal 2/3 of the ureter. No radiopaque obstructing stone is seen within the ureter. There is a 1.9 by 2.1 cm left adrenal mass. The urinary bladder is poorly evaluated as it is collapsed around suprapubic urinary Foley. Stomach/Bowel: Stomach is within normal limits. No evidence of bowel wall distention or obstruction. There is diffuse smooth wall thickening of the rectum and parts of the descending  colon. Vascular/Lymphatic: Aortic atherosclerosis. No enlarged abdominal or pelvic lymph nodes. Diffuse mesenteric stranding is seen. Reproductive: Prostate is unremarkable. Other: No abdominal wall hernia or abnormality. No abdominopelvic ascites. Musculoskeletal: No acute or significant osseous findings. IMPRESSION: Findings of chronic pancreatitis with mild peripancreatic fat stranding which suggests an element of acute pancreatitis. Long segment of narrowing of the distal extrahepatic common bile duct and diffuse dilation of the main pancreatic duct. These findings may be sequela of chronic compression from the heavily calcified pancreatic parenchyma, however a mass at the ampulla is difficult to exclude. Long segment of smooth mucosal thickening of the descending colon, and rectum. Findings may represent ischemic or infectious colitis. Malignancy although possible is favored less likely. Left hydronephrosis and proximal to mid hydroureter, without identifiable radiopaque ureteral calculus. The origin of left ureteral obstruction remains are known. Please note that the left ureter is not opacified on the delayed sequences and therefore is not well visualized. 2.1 cm left adrenal mass, stable from 2014. The long-term stability favors benign etiology. Electronically Signed   By: Fidela Salisbury M.D.   On: 11/28/2016 15:00   MRI 11/2015 [facility] - report on renals " There is a subcentimeter simple cyst in the left lower pole. The kidneys enhance symmetrically and are without hydronephrosis or enhancing renal mass.No findings of pyelonephritis on the current examination"  Assessment:   Timothy Clayton is a 50 y.o. male admitted with fevers and flank abd pain and found to have UTI with Proteus bacteremia. He has a chronic SP cath as well as chronic pancreatitis.  Clincially he is improving however he has on CT some L ureteral dilation of unclear etiology.  The most recent  imaging I can find is from 12.2016  MRI as above and from 8/216 a CT scab and the hydro was not present at that time. He has no sxs to suggest colitis.   Recommendations Continue ceftriaxone pending final cx results Would suggest a 14 day course of abx - hopefully will have oral options based on sensitivities.  Will need SP cath exchanged prior to DC Consider discussing with urology whether further work up needs to be done to eval the L hydroureter noted on CT as it does not seem to have been present on prior imaging and it may be a contributing factor to his flank pain and bacteremia.  Thank you very much for allowing me to participate in the care of this patient. Please call with questions.   Cheral Marker. Ola Spurr, MD

## 2016-11-29 NOTE — Progress Notes (Signed)
Nutrition Brief Note  Patient identified on the Malnutrition Screening Tool (MST) Report  Wt Readings from Last 15 Encounters:  11/28/16 133 lb (60.3 kg)  08/04/15 130 lb (59 kg)   Spoke with patient and son at bedside. Patient reports he is hungry and that he has a good appetite now and also PTA. Reports finishing 100% of 3 meals per day and that the meal this morning was large that's why he only had 50%. Reports nausea and abdominal pain for a few days that have not affected PO intake. Reports he is not having diarrhea as mentioned in HPI. Denies difficulty chewing/swallowing. Patient reports UBW of 133 lbs and that he is weight stable.   Body mass index is 20.22 kg/m. Patient meets criteria for Normal Weight based on current BMI.   Current diet order is Soft, patient is consuming approximately 50% of meals at this time. Labs and medications reviewed.   No nutrition interventions warranted at this time. If nutrition issues arise, please consult RD.   Helane RimaLeanne Raquelle Pietro, MS, RD, LDN Pager: [phone] After Hours Pager: [phone]

## 2016-11-29 NOTE — Progress Notes (Signed)
Pt alert. One son in [place] as [employer]. Three pastors in family. Has faith tradition. CH is available.   11/29/16 1150  Clinical Encounter Type  Visited With Patient;Health care provider  Visit Type Initial  Referral From Nurse  Spiritual Encounters  Spiritual Needs Emotional  Stress Factors  Patient Stress Factors Health changes

## 2016-11-30 LAB — BASIC METABOLIC PANEL
Anion gap: 5 (ref 5–15)
BUN: 14 mg/dL (ref 6–20)
CALCIUM: 7.7 mg/dL — AB (ref 8.9–10.3)
CHLORIDE: 105 mmol/L (ref 101–111)
CO2: 25 mmol/L (ref 22–32)
CREATININE: 1.03 mg/dL (ref 0.61–1.24)
GFR calc Af Amer: >60 mL/min (ref >60)
Glucose, Bld: 133 mg/dL — ABNORMAL HIGH (ref 65–99)
Potassium: 3.7 mmol/L (ref 3.5–5.1)
SODIUM: 135 mmol/L (ref 135–145)

## 2016-11-30 LAB — GLUCOSE, CAPILLARY
GLUCOSE-CAPILLARY: 214 mg/dL — AB (ref 65–99)
Glucose-Capillary: 135 mg/dL — ABNORMAL HIGH (ref 65–99)
Glucose-Capillary: 101 mg/dL — ABNORMAL HIGH (ref 65–99)
Glucose-Capillary: 240 mg/dL — ABNORMAL HIGH (ref 65–99)

## 2016-11-30 LAB — MAGNESIUM: Magnesium: 2 mg/dL (ref 1.7–2.4)

## 2016-11-30 NOTE — Progress Notes (Signed)
[facility] [place] [facility]   PATIENT NAME: Timothy Clayton    MR#:  [id number]  DATE OF BIRTH:  29-Mar-1966  SUBJECTIVE:  CHIEF COMPLAINT:   Chief Complaint  Patient presents with  . Abdominal Pain     Came with abdominal pain and found to have UTI, colitis and bacteremia now.   Today he is alert and oriented and says that pain is still there and asking for IV pain medications, but also asking to let him go home. After explaining him about importance of treatment and plan he agreed to comply with that and stay here.  Tolerated regular diet today, again had fever last night. Patient denies any complaints.  REVIEW OF SYSTEMS:   Constitutional: Negative for chills,Positive for fever, no weight loss.  HENT: Negative for ear discharge, ear pain and nosebleeds.   Eyes: Negative for blurred vision, pain and discharge.  Respiratory: Negative for sputum production, shortness of breath, wheezing and stridor.   Cardiovascular: Negative for chest pain, palpitations, orthopnea and PND.  Gastrointestinal: Positive for abdominal pain, diarrhea and nausea. Negative for vomiting.  Genitourinary: Negative for frequency and urgency.  Musculoskeletal: Negative for back pain and joint pain.  Neurological: Positive for weakness. Negative for sensory change, speech change and focal weakness.  Psychiatric/Behavioral: Negative for depression and hallucinations. The patient is not nervous/anxious.    ROS  DRUG ALLERGIES:  No Known Allergies  VITALS:  Blood pressure 101/63, pulse 96, temperature 98.5 F (36.9 C), temperature source Oral, resp. rate 18, height 5\' 8"  (1.727 m), weight 60.3 kg (133 lb), SpO2 98 %.  PHYSICAL EXAMINATION:  GENERAL:  50 y.o.-year-old patient lying in the bed with no acute distress.  EYES: Pupils equal, round, reactive to light and accommodation. No scleral icterus. Extraocular muscles intact.  HEENT: Head atraumatic, normocephalic. Oropharynx and  nasopharynx clear.  NECK:  Supple, no jugular venous distention. No thyroid enlargement, no tenderness.  LUNGS: Normal breath sounds bilaterally, no wheezing, rales,rhonchi or crepitation. No use of accessory muscles of respiration.  CARDIOVASCULAR: S1, S2 normal. No murmurs, rubs, or gallops.  ABDOMEN: Soft, mild tender, nondistended. Bowel sounds present. No organomegaly or mass. Supra pubic catheter in place. EXTREMITIES: No pedal edema, cyanosis, or clubbing.  NEUROLOGIC: Cranial nerves II through XII are intact. Muscle strength 5/5 in all extremities. Sensation intact. Gait not checked.  PSYCHIATRIC: The patient is alert and oriented x 3.  SKIN: No obvious rash, lesion, or ulcer.   Physical Exam LABORATORY PANEL:   CBC  Recent Labs Lab 11/29/16 0437  WBC 15.4*  HGB 12.2*  HCT 36.3*  PLT 158   ------------------------------------------------------------------------------------------------------------------  Chemistries   Recent Labs Lab 11/28/16 0941  11/30/16 0540  NA 136  < > 135  K 3.7  < > 3.7  CL 103  < > 105  CO2 25  < > 25  GLUCOSE 271*  < > 133*  BUN 25*  < > 14  CREATININE 1.53*  < > 1.03  CALCIUM 8.7*  < > 7.7*  MG 1.6*  --  2.0  AST 53*  --   --   ALT 62  --   --   ALKPHOS 130*  --   --   BILITOT 1.0  --   --   < > = values in this interval not displayed. ------------------------------------------------------------------------------------------------------------------  Cardiac Enzymes No results for input(s): TROPONINI in the last 168 hours. ------------------------------------------------------------------------------------------------------------------  RADIOLOGY:  No results found.  ASSESSMENT AND PLAN:  Active Problems:   Sepsis (HCC)   Colitis   Generalized abdominal pain   Alcohol-induced chronic pancreatitis (HCC)  Timothy Clayton  is a 50 y.o. male with a known history of Chronic pancreatitis alcohol-related quit drinking alcohol a  few years ago, neurogenic bladder due to diabetes status post suprapubic catheter, uncontrolled diabetes on insulin, neuropathy, retinopathy comes to the emergency room having complaints of right upper quadrant and flank pain with 2 days of diarrhea  1. Sepsis source appears urine and colitis - also have bacteremia -IV Zosyn. Pharmacy to dose -Full liquid diet- upgrade as he can tolerate. -GI saw patient for abnormal CT scan which shows long segment of smooth mucosal thickening of the descending colon and rectum ischemic or infectious colitis- no new recommendation. -Follow WBC count and blood cultures - ID consult to help due to bacteremia with proteus. - suggest 14 days treatment.  2. UTI due to indwelling suprapubic catheter -Follow up urine culture -On IV Zosyn  3. Uncontrolled diabetes We'll continue home dose Lantus and and sliding scale  4. Hypertension continue home meds  5. DVT prophylaxis subcutaneous Lovenox   All the records are reviewed and case discussed with Care Management/Social Workerr. Management plans discussed with the patient, family and they are in agreement.  CODE STATUS: full.  TOTAL TIME TAKING CARE OF THIS PATIENT: 35 minutes.    POSSIBLE D/C IN 1-2 DAYS, DEPENDING ON CLINICAL CONDITION. Waiting for blood culture.  Altamese DillingVACHHANI, Zamir Staples M.D on 11/30/2016   Between 7am to 6pm - Pager - [phone]  After 6pm go to [email or web address] - Social research officer, governmentpassword EPAS [facility]  [facility]  Office  [phone]  CC: Primary care physician; No PCP Per Patient  Note: This dictation was prepared with Dragon dictation along with smaller phrase technology. Any transcriptional errors that result from this process are unintentional.

## 2016-11-30 NOTE — Progress Notes (Signed)
[facility] Date of Admission:  11/28/2016     ID: Timothy Clayton is a 50 y.o. male with    Active Problems:   Sepsis (HCC)   Colitis   Generalized abdominal pain   Alcohol-induced chronic pancreatitis (HCC)  Subjective: Fever improving, no diarrhea   ROS  Eleven systems are reviewed and negative except per hpi  Medications:  Antibiotics Given (last 72 hours)    Date/Time Action Medication Dose Rate   11/28/16 1742 Given   piperacillin-tazobactam (ZOSYN) IVPB 3.375 g 3.375 g 12.5 mL/hr   11/29/16 0531 Given   piperacillin-tazobactam (ZOSYN) IVPB 3.375 g 3.375 g 12.5 mL/hr   11/29/16 1354 Given   piperacillin-tazobactam (ZOSYN) IVPB 3.375 g 3.375 g 12.5 mL/hr   11/29/16 2159 Given   piperacillin-tazobactam (ZOSYN) IVPB 3.375 g 3.375 g 12.5 mL/hr   11/30/16 0520 Given   piperacillin-tazobactam (ZOSYN) IVPB 3.375 g 3.375 g 12.5 mL/hr     . cilostazol  100 mg Oral BID  . enoxaparin (LOVENOX) injection  40 mg Subcutaneous Q24H  . insulin aspart  0-5 Units Subcutaneous QHS  . insulin aspart  0-9 Units Subcutaneous TID WC  . insulin glargine  12 Units Subcutaneous QHS  . lipase/protease/amylase  12,000 Units Oral TID WC  . mirtazapine  15 mg Oral QHS  . nicotine  21 mg Transdermal Daily  . OXcarbazepine  150 mg Oral QHS  . pantoprazole  40 mg Oral Daily  . piperacillin-tazobactam (ZOSYN)  IV  3.375 g Intravenous Q8H  . potassium chloride  20 mEq Oral BID  . Vitamin D (Ergocalciferol)  50,000 Units Oral Once per day on Mon Thu    Objective: Vital signs in last 24 hours: Temp:  [97.8 F (36.6 C)-101.3 F (38.5 C)] 97.8 F (36.6 C) (12/12 0425) Pulse Rate:  [84-104] 84 (12/12 0425) Resp:  [18-20] 18 (12/12 0425) BP: (170-176)/(89-95) 176/95 (12/12 0425) SpO2:  [98 %-99 %] 99 % (12/12 0425) Constitutional: He is oriented to person, place, and time. He appears thin and chronically ill appearing.  HENT: anicteric, muddy  sclera Mouth/Throat: Oropharynx is clear and moist. No oropharyngeal exudate.  Cardiovascular: Normal rate, regular rhythm and normal heart sounds. Pulmonary/Chest: Effort normal and breath sounds normal. No respiratory distress. He has no wheezes.  Abdominal: Soft. Bowel sounds are normal. He exhibits no distension. There is no tenderness.  SP cath in place with no drainage   Lymphadenopathy: He has no cervical adenopathy.  Neurological: He is alert and oriented to person, place, and time.  Skin: Skin is warm and dry. No rash noted. No erythema.  Psychiatric: He has a normal mood and affect. His behavior is normal.   Lab Results  Recent Labs  11/28/16 0941 11/29/16 0437 11/30/16 0540  WBC 21.3* 15.4*  --   HGB 14.6 12.2*  --   HCT 43.1 36.3*  --   NA 136 135 135  K 3.7 3.4* 3.7  CL 103 106 105  CO2 25 24 25   BUN 25* 24* 14  CREATININE 1.53* 1.38* 1.03    Microbiology: Results for orders placed or performed during the hospital encounter of 11/28/16  Urine culture     Status: Abnormal   Collection Time: 11/28/16 11:15 AM  Result Value Ref Range Status   Specimen Description URINE, RANDOM  Final   Special Requests Normal  Final   Culture MULTIPLE SPECIES PRESENT, SUGGEST RECOLLECTION (A)  Final   Report Status 11/29/2016 FINAL  Final  Culture, blood (Routine X 2) w Reflex to ID Panel     Status: Abnormal (Preliminary result)   Collection Time: 11/28/16 11:22 AM  Result Value Ref Range Status   Specimen Description BLOOD LEFT ARM  Final   Special Requests   Final    BOTTLES DRAWN AEROBIC AND ANAEROBIC AER ANA   Culture  Setup Time   Final    GRAM NEGATIVE RODS IN BOTH AEROBIC AND ANAEROBIC BOTTLES CRITICAL RESULT CALLED TO, READ BACK BY AND VERIFIED WITH: HANK ZOMPA 11/29/16 1610 SGD Performed at [facility]    Culture PROTEUS MIRABILIS (A)  Final   Report Status PENDING  Incomplete  Culture, blood (Routine X 2) w Reflex to ID Panel     Status: None  (Preliminary result)   Collection Time: 11/28/16 11:22 AM  Result Value Ref Range Status   Specimen Description BLOOD RIGHT ARM  Final   Special Requests   Final    BOTTLES DRAWN AEROBIC AND ANAEROBIC ANA AER 9 ML   Culture  Setup Time   Final    GRAM NEGATIVE RODS IN BOTH AEROBIC AND ANAEROBIC BOTTLES PREVIOUSLY NOTED AND CALLED TO HANK ZOMPA ON 11/29/16 AT 0739 BY SDR    Culture GRAM NEGATIVE RODS  Final   Report Status PENDING  Incomplete  Blood Culture ID Panel (Reflexed)     Status: Abnormal   Collection Time: 11/28/16 11:22 AM  Result Value Ref Range Status   Enterococcus species NOT DETECTED NOT DETECTED Final   Vancomycin resistance NOT DETECTED NOT DETECTED Final   Listeria monocytogenes NOT DETECTED NOT DETECTED Final   Staphylococcus species NOT DETECTED NOT DETECTED Final   Staphylococcus aureus NOT DETECTED NOT DETECTED Final   Methicillin resistance NOT DETECTED NOT DETECTED Final   Streptococcus species NOT DETECTED NOT DETECTED Final   Streptococcus agalactiae NOT DETECTED NOT DETECTED Final   Streptococcus pneumoniae NOT DETECTED NOT DETECTED Final   Streptococcus pyogenes NOT DETECTED NOT DETECTED Final   Acinetobacter baumannii NOT DETECTED NOT DETECTED Final   Enterobacteriaceae species DETECTED (A) NOT DETECTED Final    Comment: CRITICAL RESULT CALLED TO, READ BACK BY AND VERIFIED WITH: HANK ZOMPA 11/29/16 0739 SGD    Enterobacter cloacae complex NOT DETECTED NOT DETECTED Final   Escherichia coli NOT DETECTED NOT DETECTED Final   Klebsiella oxytoca NOT DETECTED NOT DETECTED Final   Klebsiella pneumoniae NOT DETECTED NOT DETECTED Final   Proteus species DETECTED (A) NOT DETECTED Final    Comment: CRITICAL RESULT CALLED TO, READ BACK BY AND VERIFIED WITH: HANK ZOMPA 11/29/16 0739 SGD    Serratia marcescens NOT DETECTED NOT DETECTED Final   Carbapenem resistance NOT DETECTED NOT DETECTED Final   Haemophilus influenzae NOT DETECTED NOT DETECTED Final    Neisseria meningitidis NOT DETECTED NOT DETECTED Final   Pseudomonas aeruginosa NOT DETECTED NOT DETECTED Final   Candida albicans NOT DETECTED NOT DETECTED Final   Candida glabrata NOT DETECTED NOT DETECTED Final   Candida krusei NOT DETECTED NOT DETECTED Final   Candida parapsilosis NOT DETECTED NOT DETECTED Final   Candida tropicalis NOT DETECTED NOT DETECTED Final  MRSA PCR Screening     Status: None   Collection Time: 11/28/16  5:36 PM  Result Value Ref Range Status   MRSA by PCR NEGATIVE NEGATIVE Final    Comment:        The GeneXpert MRSA Assay (FDA approved for NASAL specimens only), is one component of a comprehensive MRSA colonization surveillance  program. It is not intended to diagnose MRSA infection nor to guide or monitor treatment for MRSA infections.      Studies/Results: Ct Abdomen Pelvis W Contrast  Result Date: 11/28/2016 CLINICAL DATA:  Abdominal pain with anorexia for to 3 days. History of pancreatitis and suprapubic catheter. EXAM: CT ABDOMEN AND PELVIS WITH CONTRAST TECHNIQUE: Multidetector CT imaging of the abdomen and pelvis was performed using the standard protocol following bolus administration of intravenous contrast. CONTRAST:  80mL ISOVUE-300 IOPAMIDOL (ISOVUE-300) INJECTION 61% COMPARISON:  08/05/2015 FINDINGS: Lower chest: No acute abnormality. Stable right lower lobe subpleural 5 mm ground-glass pulmonary nodule. Hepatobiliary: No focal liver abnormality is seen. No gallstones, gallbladder wall thickening, or intra hepatic biliary dilatation. There is a long segment of smooth narrowing of the distal extrahepatic common bile duct best seen on the coronal images, image 35/79, sequence 5. Pancreas: Coarse calcifications throughout the atrophic pancreatic parenchyma are suggestive of chronic pancreatitis. Mild peripancreatic fat stranding may be seen with element of acute pancreatitis. The main pancreatic duct is diffusely dilated. Spleen: Normal in size  without focal abnormality. Adrenals/Urinary Tract: Normal appearance of the right adrenal gland. There are bilateral few mm nonobstructive renal calculi in the lower poles. Small 6 mm probable left renal cyst is seen in the lower pole as well. There is a mild left hydronephrosis. The left ureter is also hyperenhancing and dilated to the level of the distal 2/3 of the ureter. No radiopaque obstructing stone is seen within the ureter. There is a 1.9 by 2.1 cm left adrenal mass. The urinary bladder is poorly evaluated as it is collapsed around suprapubic urinary Foley. Stomach/Bowel: Stomach is within normal limits. No evidence of bowel wall distention or obstruction. There is diffuse smooth wall thickening of the rectum and parts of the descending colon. Vascular/Lymphatic: Aortic atherosclerosis. No enlarged abdominal or pelvic lymph nodes. Diffuse mesenteric stranding is seen. Reproductive: Prostate is unremarkable. Other: No abdominal wall hernia or abnormality. No abdominopelvic ascites. Musculoskeletal: No acute or significant osseous findings. IMPRESSION: Findings of chronic pancreatitis with mild peripancreatic fat stranding which suggests an element of acute pancreatitis. Long segment of narrowing of the distal extrahepatic common bile duct and diffuse dilation of the main pancreatic duct. These findings may be sequela of chronic compression from the heavily calcified pancreatic parenchyma, however a mass at the ampulla is difficult to exclude. Long segment of smooth mucosal thickening of the descending colon, and rectum. Findings may represent ischemic or infectious colitis. Malignancy although possible is favored less likely. Left hydronephrosis and proximal to mid hydroureter, without identifiable radiopaque ureteral calculus. The origin of left ureteral obstruction remains are known. Please note that the left ureter is not opacified on the delayed sequences and therefore is not well visualized. 2.1 cm left  adrenal mass, stable from 2014. The long-term stability favors benign etiology. Electronically Signed   By: Ted Mcalpineobrinka  Dimitrova M.D.   On: 11/28/2016 15:00    Assessment/Plan: Timothy Clayton is a 50 y.o. male admitted with fevers and flank abd pain and found to have UTI with Proteus bacteremia. He has a chronic SP cath as well as chronic pancreatitis.  Clincially he is improving however he has on CT some L ureteral dilation of unclear etiology.  The most recent  imaging I can find is from 12.2016 MRI as above and from 8/216 a CT scab and the hydro was not present at that time. He has no sxs to suggest colitis.   Recommendations Continue zosyn pending final cx results  Would suggest a 14 day course of abx - hopefully will have oral options based on sensitivities.  Will need SP cath exchanged prior to DC Consider discussing with urology whether further work up needs to be done to eval the L hydroureter noted on CT as it does not seem to have been present on prior imaging and it may be a contributing factor to his flank pain and bacteremia.  Thank you very much for the consult. Will follow with you.  FITZGERALD, DAVID P   11/30/2016, 2:08 PM

## 2016-11-30 NOTE — Progress Notes (Signed)
Timothy Miniumarren Bessie Livingood, MD [facility]   [address] [place], [place] [address] Phone: [phone] Fax : [phone]   Subjective: The patient reports that he has no further abdominal pain today. There is also no further reports of any change in his bowel habits.   Objective: Vital signs in last 24 hours: Vitals:   11/29/16 1835 11/29/16 2150 11/29/16 2300 11/30/16 0425  BP:  (!) 170/89  (!) 176/95  Pulse:  (!) 104  84  Resp:  20  18  Temp: 100.3 F (37.9 C) (!) 101.3 F (38.5 C) 99.7 F (37.6 C) 97.8 F (36.6 C)  TempSrc: Tympanic Oral  Oral  SpO2:  98%  99%  Weight:      Height:       Weight change:   Intake/Output Summary (Last 24 hours) at 11/30/16 1126 Last data filed at 11/30/16 [id number]  Gross per 24 hour  Intake            682.2 ml  Output             2500 ml  Net          -1817.8 ml     Exam: Heart:: Regular rate and rhythm Lungs: normal Abdomen: soft, nontender, normal bowel sounds   Lab Results: @LABTEST2 @ Micro Results: Recent Results (from the past 240 hour(s))  Urine culture     Status: Abnormal   Collection Time: 11/28/16 11:15 AM  Result Value Ref Range Status   Specimen Description URINE, RANDOM  Final   Special Requests Normal  Final   Culture MULTIPLE SPECIES PRESENT, SUGGEST RECOLLECTION (A)  Final   Report Status 11/29/2016 FINAL  Final  Culture, blood (Routine X 2) w Reflex to ID Panel     Status: None (Preliminary result)   Collection Time: 11/28/16 11:22 AM  Result Value Ref Range Status   Specimen Description BLOOD LEFT ARM  Final   Special Requests   Final    BOTTLES DRAWN AEROBIC AND ANAEROBIC AER 9ML ANA 10ML   Culture  Setup Time   Final    GRAM NEGATIVE RODS IN BOTH AEROBIC AND ANAEROBIC BOTTLES CRITICAL RESULT CALLED TO, READ BACK BY AND VERIFIED WITH: HANK ZOMPA 11/29/16 [id number] S[facility]    Culture GRAM NEGATIVE RODS  Final   Report Status PENDING  Incomplete  Culture, blood (Routine X 2) w Reflex  to ID Panel     Status: None (Preliminary result)   Collection Time: 11/28/16 11:22 AM  Result Value Ref Range Status   Specimen Description BLOOD RIGHT ARM  Final   Special Requests   Final    BOTTLES DRAWN AEROBIC AND ANAEROBIC ANA 14ML AER 9 ML   Culture  Setup Time   Final    GRAM NEGATIVE RODS IN BOTH AEROBIC AND ANAEROBIC BOTTLES PREVIOUSLY NOTED AND CALLED TO HANK ZOMPA ON 11/29/16 AT 0739 BY SDR    Culture GRAM NEGATIVE RODS  Final   Report Status PENDING  Incomplete  Blood Culture ID Panel (Reflexed)     Status: Abnormal   Collection Time: 11/28/16 11:22 AM  Result Value Ref Range Status   Enterococcus species NOT DETECTED NOT DETECTED Final   Vancomycin resistance NOT DETECTED NOT DETECTED Final   Listeria monocytogenes NOT DETECTED NOT DETECTED Final   Staphylococcus species NOT DETECTED NOT DETECTED Final   Staphylococcus aureus NOT DETECTED NOT DETECTED Final   Methicillin resistance NOT DETECTED NOT DETECTED Final   Streptococcus species NOT  DETECTED NOT DETECTED Final   Streptococcus agalactiae NOT DETECTED NOT DETECTED Final   Streptococcus pneumoniae NOT DETECTED NOT DETECTED Final   Streptococcus pyogenes NOT DETECTED NOT DETECTED Final   Acinetobacter baumannii NOT DETECTED NOT DETECTED Final   Enterobacteriaceae species DETECTED (A) NOT DETECTED Final    Comment: CRITICAL RESULT CALLED TO, READ BACK BY AND VERIFIED WITH: HANK ZOMPA 11/29/16 0739 SGD    Enterobacter cloacae complex NOT DETECTED NOT DETECTED Final   Escherichia coli NOT DETECTED NOT DETECTED Final   Klebsiella oxytoca NOT DETECTED NOT DETECTED Final   Klebsiella pneumoniae NOT DETECTED NOT DETECTED Final   Proteus species DETECTED (A) NOT DETECTED Final    Comment: CRITICAL RESULT CALLED TO, READ BACK BY AND VERIFIED WITH: HANK ZOMPA 11/29/16 0739 SGD    Serratia marcescens NOT DETECTED NOT DETECTED Final   Carbapenem resistance NOT DETECTED NOT DETECTED Final   Haemophilus influenzae NOT  DETECTED NOT DETECTED Final   Neisseria meningitidis NOT DETECTED NOT DETECTED Final   Pseudomonas aeruginosa NOT DETECTED NOT DETECTED Final   Candida albicans NOT DETECTED NOT DETECTED Final   Candida glabrata NOT DETECTED NOT DETECTED Final   Candida krusei NOT DETECTED NOT DETECTED Final   Candida parapsilosis NOT DETECTED NOT DETECTED Final   Candida tropicalis NOT DETECTED NOT DETECTED Final  MRSA PCR Screening     Status: None   Collection Time: 11/28/16  5:36 PM  Result Value Ref Range Status   MRSA by PCR NEGATIVE NEGATIVE Final    Comment:        The GeneXpert MRSA Assay (FDA approved for NASAL specimens only), is one component of a comprehensive MRSA colonization surveillance program. It is not intended to diagnose MRSA infection nor to guide or monitor treatment for MRSA infections.    Studies/Results: Ct Abdomen Pelvis W Contrast  Result Date: 11/28/2016 CLINICAL DATA:  Abdominal pain with anorexia for to 3 days. History of pancreatitis and suprapubic catheter. EXAM: CT ABDOMEN AND PELVIS WITH CONTRAST TECHNIQUE: Multidetector CT imaging of the abdomen and pelvis was performed using the standard protocol following bolus administration of intravenous contrast. CONTRAST:  80mL ISOVUE-300 IOPAMIDOL (ISOVUE-300) INJECTION 61% COMPARISON:  08/05/2015 FINDINGS: Lower chest: No acute abnormality. Stable right lower lobe subpleural 5 mm ground-glass pulmonary nodule. Hepatobiliary: No focal liver abnormality is seen. No gallstones, gallbladder wall thickening, or intra hepatic biliary dilatation. There is a long segment of smooth narrowing of the distal extrahepatic common bile duct best seen on the coronal images, image 35/79, sequence 5. Pancreas: Coarse calcifications throughout the atrophic pancreatic parenchyma are suggestive of chronic pancreatitis. Mild peripancreatic fat stranding may be seen with element of acute pancreatitis. The main pancreatic duct is diffusely dilated.  Spleen: Normal in size without focal abnormality. Adrenals/Urinary Tract: Normal appearance of the right adrenal gland. There are bilateral few mm nonobstructive renal calculi in the lower poles. Small 6 mm probable left renal cyst is seen in the lower pole as well. There is a mild left hydronephrosis. The left ureter is also hyperenhancing and dilated to the level of the distal 2/3 of the ureter. No radiopaque obstructing stone is seen within the ureter. There is a 1.9 by 2.1 cm left adrenal mass. The urinary bladder is poorly evaluated as it is collapsed around suprapubic urinary Foley. Stomach/Bowel: Stomach is within normal limits. No evidence of bowel wall distention or obstruction. There is diffuse smooth wall thickening of the rectum and parts of the descending colon. Vascular/Lymphatic: Aortic atherosclerosis. No enlarged  abdominal or pelvic lymph nodes. Diffuse mesenteric stranding is seen. Reproductive: Prostate is unremarkable. Other: No abdominal wall hernia or abnormality. No abdominopelvic ascites. Musculoskeletal: No acute or significant osseous findings. IMPRESSION: Findings of chronic pancreatitis with mild peripancreatic fat stranding which suggests an element of acute pancreatitis. Long segment of narrowing of the distal extrahepatic common bile duct and diffuse dilation of the main pancreatic duct. These findings may be sequela of chronic compression from the heavily calcified pancreatic parenchyma, however a mass at the ampulla is difficult to exclude. Long segment of smooth mucosal thickening of the descending colon, and rectum. Findings may represent ischemic or infectious colitis. Malignancy although possible is favored less likely. Left hydronephrosis and proximal to mid hydroureter, without identifiable radiopaque ureteral calculus. The origin of left ureteral obstruction remains are known. Please note that the left ureter is not opacified on the delayed sequences and therefore is not well  visualized. 2.1 cm left adrenal mass, stable from 2014. The long-term stability favors benign etiology. Electronically Signed   By: Ted Mcalpine M.D.   On: 11/28/2016 15:00   Medications: I have reviewed the patient's current medications. Scheduled Meds: . cilostazol  100 mg Oral BID  . enoxaparin (LOVENOX) injection  40 mg Subcutaneous Q24H  . insulin aspart  0-5 Units Subcutaneous QHS  . insulin aspart  0-9 Units Subcutaneous TID WC  . insulin glargine  12 Units Subcutaneous QHS  . lipase/protease/amylase  12,000 Units Oral TID WC  . mirtazapine  15 mg Oral QHS  . nicotine  21 mg Transdermal Daily  . OXcarbazepine  150 mg Oral QHS  . pantoprazole  40 mg Oral Daily  . piperacillin-tazobactam (ZOSYN)  IV  3.375 g Intravenous Q8H  . potassium chloride  20 mEq Oral BID  . Vitamin D (Ergocalciferol)  50,000 Units Oral Once per day on Mon Thu   Continuous Infusions: PRN Meds:.acetaminophen, morphine injection, ondansetron (ZOFRAN) IV, oxyCODONE-acetaminophen, traMADol   Assessment: Active Problems:   Sepsis (HCC)   Colitis   Generalized abdominal pain   Alcohol-induced chronic pancreatitis (HCC)    Plan: This patient had an abnormal CT scan with thickening of the left colon. The patient's abdominal pain has resolved. The patient is being treated by infectious disease for positive blood cultures. Nothing further to do from a GI point of view. If the concern for his thickening of his left colon continues then outpatient follow-up would be appropriate.   LOS: 2 days   Timothy Minium 11/30/2016, 11:26 AM

## 2016-11-30 NOTE — Progress Notes (Signed)
Pt alert and stated fever had broken last night. Pt has 3 pastors in family who have visited. CH is available.   11/30/16 1055  Clinical Encounter Type  Visited With Patient  Visit Type Follow-up  Referral From Chaplain  Spiritual Encounters  Spiritual Needs Emotional  Stress Factors  Patient Stress Factors None identified

## 2016-12-01 LAB — CULTURE, BLOOD (ROUTINE X 2)

## 2016-12-01 LAB — GLUCOSE, CAPILLARY
GLUCOSE-CAPILLARY: 80 mg/dL (ref 65–99)
Glucose-Capillary: 161 mg/dL — ABNORMAL HIGH (ref 65–99)

## 2016-12-01 MED ORDER — AMOXICILLIN-POT CLAVULANATE 875-125 MG PO TABS: 1.0000 | ORAL_TABLET | Freq: Two times a day (BID) | ORAL | 0 refills | Status: DC

## 2016-12-01 MED ORDER — AMOXICILLIN-POT CLAVULANATE 875-125 MG PO TABS
1.0000 | ORAL_TABLET | Freq: Two times a day (BID) | ORAL | Status: DC
  Administered 2016-12-01: 1 via ORAL
  Filled 2016-12-01: qty 1

## 2016-12-01 NOTE — Discharge Instructions (Signed)
° °  Urinary Tract Infection, Adult Introduction A urinary tract infection (UTI) is an infection of any part of the urinary tract. The urinary tract includes the:  Kidneys.  Ureters.  Bladder.  Urethra. These organs make, store, and get rid of pee (urine) in the body. Follow these instructions at home:  Take over-the-counter and prescription medicines only as told by your doctor.  If you were prescribed an antibiotic medicine, take it as told by your doctor. Do not stop taking the antibiotic even if you start to feel better.  Avoid the following drinks:  Alcohol.  Caffeine.  Tea.  Carbonated drinks.  Drink enough fluid to keep your pee clear or pale yellow.  Keep all follow-up visits as told by your doctor. This is important.  Make sure to:  Empty your bladder often and completely. Do not to hold pee for long periods of time.  Empty your bladder before and after sex.  Wipe from front to back after a bowel movement if you are male. Use each tissue one time when you wipe. Contact a doctor if:  You have back pain.  You have a fever.  You feel sick to your stomach (nauseous).  You throw up (vomit).  Your symptoms do not get better after 3 days.  Your symptoms go away and then come back. Get help right away if:  You have very bad back pain.  You have very bad lower belly (abdominal) pain.  You are throwing up and cannot keep down any medicines or water. This information is not intended to replace advice given to you by your health care provider. Make sure you discuss any questions you have with your health care provider. Document Released: 05/24/2008 Document Revised: 05/13/2016 Document Reviewed: 10/27/2015  2017 Elsevier     Pt [facility] get his [facility]ubic [facility] He has appt for it  Pt [facility]

## 2016-12-01 NOTE — Discharge Summary (Signed)
[facility] [place] [facility]   PATIENT NAME: Timothy Clayton    MR#:  [id number]  DATE OF BIRTH:  1966-04-30  DATE OF ADMISSION:  11/28/2016 ADMITTING PHYSICIAN: Enedina Finner, MD  DATE OF DISCHARGE: 12/01/16  PRIMARY CARE PHYSICIAN: No PCP Per Patient    ADMISSION DIAGNOSIS:  Colitis [K52.9] Urinary tract infection, acute [N39.0]  DISCHARGE DIAGNOSIS:  Sepsis-Proteus mirabilis UTI with chronic indwelling suprapubic catheter Acute Colitis  SECONDARY DIAGNOSIS:   Past Medical History:  Diagnosis Date  . Diabetes mellitus without complication (HCC)   . Pancreatitis   . Suprapubic catheter [place])     HOSPITAL COURSE:  Timothy Clayton a 50 y.o. malewith a known history of Chronic pancreatitis alcohol-related quit drinking alcohol a few years ago, neurogenic bladder due to diabetes status post suprapubic catheter, uncontrolled diabetes on insulin, neuropathy, retinopathy comes to the emergency room having complaints of right upper quadrant and flank pain with 2 days of diarrhea  1. Sepsis source appears urine and colitis -Proteus mirabilits -IV Zosyn---po augmentin -Full liquid diet- upgrade as he can tolerate. -GI saw patient for abnormal CT scan which shows long segment of smooth mucosal thickening of the descending colon and rectum ischemic or infectious colitis- no new recommendation. F/u as out pt - ID consult to help due to bacteremia with proteus. - suggest 14 days treatment.  2. UTI due to indwelling suprapubic catheter (pt gets it changed at [facility] every month on the 18th) -proteus -On IV Zosyn---change to po augmentin  3. Uncontrolled diabetes We'll continue home dose Lantus and and sliding scale  4. Hypertension continue home meds  5. DVT prophylaxis subcutaneous Lovenox  Overall better D/c home  CONSULTS OBTAINED:  Treatment Team:  Midge Minium, MD Mick Sell, MD  DRUG ALLERGIES:  No Known  Allergies  DISCHARGE MEDICATIONS:   Current Discharge Medication List    START taking these medications   Details  amoxicillin-clavulanate (AUGMENTIN) 875-125 MG tablet Take 1 tablet by mouth every 12 (twelve) hours. Qty: 14 tablet, Refills: 0      CONTINUE these medications which have NOT CHANGED   Details  acetaminophen (TYLENOL) 500 MG tablet Take 500 mg by mouth every 6 (six) hours as needed for moderate pain.     cilostazol (PLETAL) 100 MG tablet Take 100 mg by mouth 2 (two) times daily.    insulin glargine (LANTUS) 100 UNIT/ML injection Inject 12 Units into the skin at bedtime.    mirtazapine (REMERON) 15 MG tablet Take 15 mg by mouth at bedtime.    omeprazole (PRILOSEC) 20 MG capsule Take 20 mg by mouth daily.    OXcarbazepine (TRILEPTAL) 150 MG tablet Take 150 mg by mouth at bedtime.    oxyCODONE-acetaminophen (PERCOCET) 7.5-325 MG tablet Take 1 tablet by mouth every 8 (eight) hours as needed for severe pain.     Pancrelipase, Lip-Prot-Amyl, 6000 units CPEP Take 6,000 Units by mouth 3 (three) times daily with meals.    Vitamin D, Ergocalciferol, (DRISDOL) 50000 units CAPS capsule Take 50,000 Units by mouth 2 (two) times a week. On Tuesday and Thursday        If you experience worsening of your admission symptoms, develop shortness of breath, life threatening emergency, suicidal or homicidal thoughts you must seek medical attention immediately by calling 911 or calling your MD immediately  if symptoms less severe.  You Must read complete instructions/literature along with all the possible adverse reactions/side effects for all the Medicines you take and that  have been prescribed to you. Take any new Medicines after you have completely understood and accept all the possible adverse reactions/side effects.   Please note  You were cared for by a hospitalist during your hospital stay. If you have any questions about your discharge medications or the care you received while  you were in the hospital after you are discharged, you can call the unit and asked to speak with the hospitalist on call if the hospitalist that took care of you is not available. Once you are discharged, your primary care physician will handle any further medical issues. Please note that NO REFILLS for any discharge medications will be authorized once you are discharged, as it is imperative that you return to your primary care physician (or establish a relationship with a primary care physician if you do not have one) for your aftercare needs so that they can reassess your need for medications and monitor your lab values. Today   SUBJECTIVE   Feels better. Wants to go home  VITAL SIGNS:  Blood pressure (!) 167/90, pulse 87, temperature 98.1 F (36.7 C), temperature source Oral, resp. rate (!) 8, height 5\' 8"  (1.727 m), weight 60.3 kg (133 lb), SpO2 100 %.  I/O:   Intake/Output Summary (Last 24 hours) at 12/01/16 0902 Last data filed at 12/01/16 0749  Gross per 24 hour  Intake              680 ml  Output             2350 ml  Net            -1670 ml    PHYSICAL EXAMINATION:  GENERAL:  50 y.o.-year-old patient lying in the bed with no acute distress.  EYES: Pupils equal, round, reactive to light and accommodation. No scleral icterus. Extraocular muscles intact.  HEENT: Head atraumatic, normocephalic. Oropharynx and nasopharynx clear.  NECK:  Supple, no jugular venous distention. No thyroid enlargement, no tenderness.  LUNGS: Normal breath sounds bilaterally, no wheezing, rales,rhonchi or crepitation. No use of accessory muscles of respiration.  CARDIOVASCULAR: S1, S2 normal. No murmurs, rubs, or gallops.  ABDOMEN: Soft, non-tender, non-distended. Bowel sounds present. No organomegaly or mass. Suprapubic cath+ EXTREMITIES: No pedal edema, cyanosis, or clubbing.  NEUROLOGIC: Cranial nerves II through XII are intact. Muscle strength 5/5 in all extremities. Sensation intact. Gait not checked.   PSYCHIATRIC: The patient is alert and oriented x 3.  SKIN: No obvious rash, lesion, or ulcer.   DATA REVIEW:   CBC   Recent Labs Lab 11/29/16 0437  WBC 15.4*  HGB 12.2*  HCT 36.3*  PLT 158    Chemistries   Recent Labs Lab 11/28/16 0941  11/30/16 0540  NA 136  < > 135  K 3.7  < > 3.7  CL 103  < > 105  CO2 25  < > 25  GLUCOSE 271*  < > 133*  BUN 25*  < > 14  CREATININE 1.53*  < > 1.03  CALCIUM 8.7*  < > 7.7*  MG 1.6*  --  2.0  AST 53*  --   --   ALT 62  --   --   ALKPHOS 130*  --   --   BILITOT 1.0  --   --   < > = values in this interval not displayed.  Microbiology Results   Recent Results (from the past 240 hour(s))  Urine culture     Status: Abnormal   Collection Time:  11/28/16 11:15 AM  Result Value Ref Range Status   Specimen Description URINE, RANDOM  Final   Special Requests Normal  Final   Culture MULTIPLE SPECIES PRESENT, SUGGEST RECOLLECTION (A)  Final   Report Status 11/29/2016 FINAL  Final  Culture, blood (Routine X 2) w Reflex to ID Panel     Status: Abnormal   Collection Time: 11/28/16 11:22 AM  Result Value Ref Range Status   Specimen Description BLOOD LEFT ARM  Final   Special Requests   Final    BOTTLES DRAWN AEROBIC AND ANAEROBIC AER 9ML ANA 10ML   Culture  Setup Time   Final    GRAM NEGATIVE RODS IN BOTH AEROBIC AND ANAEROBIC BOTTLES CRITICAL RESULT CALLED TO, READ BACK BY AND VERIFIED WITH: HANK ZOMPA 11/29/16 [id number] SGD [facility]    Culture PROTEUS MIRABILIS (A)  Final   Report Status 12/01/2016 FINAL  Final   Organism ID, Bacteria PROTEUS MIRABILIS  Final      Susceptibility   Proteus mirabilis - MIC*    AMPICILLIN <=2 SENSITIVE Sensitive     CEFAZOLIN <=4 SENSITIVE Sensitive     CEFEPIME <=1 SENSITIVE Sensitive     CEFTAZIDIME <=1 SENSITIVE Sensitive     CEFTRIAXONE <=1 SENSITIVE Sensitive     CIPROFLOXACIN <=0.25 SENSITIVE Sensitive     GENTAMICIN <=1 SENSITIVE Sensitive     IMIPENEM 8 INTERMEDIATE  Intermediate     TRIMETH/SULFA <=20 SENSITIVE Sensitive     AMPICILLIN/SULBACTAM <=2 SENSITIVE Sensitive     PIP/TAZO <=4 SENSITIVE Sensitive     * PROTEUS MIRABILIS  Culture, blood (Routine X 2) w Reflex to ID Panel     Status: None (Preliminary result)   Collection Time: 11/28/16 11:22 AM  Result Value Ref Range Status   Specimen Description BLOOD RIGHT ARM  Final   Special Requests   Final    BOTTLES DRAWN AEROBIC AND ANAEROBIC ANA 14ML AER 9 ML   Culture  Setup Time   Final    GRAM NEGATIVE RODS IN BOTH AEROBIC AND ANAEROBIC BOTTLES PREVIOUSLY NOTED AND CALLED TO HANK ZOMPA ON 11/29/16 AT 0739 BY SDR    Culture GRAM NEGATIVE RODS  Final   Report Status PENDING  Incomplete  Blood Culture ID Panel (Reflexed)     Status: Abnormal   Collection Time: 11/28/16 11:22 AM  Result Value Ref Range Status   Enterococcus species NOT DETECTED NOT DETECTED Final   Vancomycin resistance NOT DETECTED NOT DETECTED Final   Listeria monocytogenes NOT DETECTED NOT DETECTED Final   Staphylococcus species NOT DETECTED NOT DETECTED Final   Staphylococcus aureus NOT DETECTED NOT DETECTED Final   Methicillin resistance NOT DETECTED NOT DETECTED Final   Streptococcus species NOT DETECTED NOT DETECTED Final   Streptococcus agalactiae NOT DETECTED NOT DETECTED Final   Streptococcus pneumoniae NOT DETECTED NOT DETECTED Final   Streptococcus pyogenes NOT DETECTED NOT DETECTED Final   Acinetobacter baumannii NOT DETECTED NOT DETECTED Final   Enterobacteriaceae species DETECTED (A) NOT DETECTED Final    Comment: CRITICAL RESULT CALLED TO, READ BACK BY AND VERIFIED WITH: HANK ZOMPA 11/29/16 0739 SGD    Enterobacter cloacae complex NOT DETECTED NOT DETECTED Final   Escherichia coli NOT DETECTED NOT DETECTED Final   Klebsiella oxytoca NOT DETECTED NOT DETECTED Final   Klebsiella pneumoniae NOT DETECTED NOT DETECTED Final   Proteus species DETECTED (A) NOT DETECTED Final    Comment: CRITICAL RESULT CALLED  TO, READ BACK BY AND VERIFIED WITH:  HANK ZOMPA 11/29/16 0739 SGD    Serratia marcescens NOT DETECTED NOT DETECTED Final   Carbapenem resistance NOT DETECTED NOT DETECTED Final   Haemophilus influenzae NOT DETECTED NOT DETECTED Final   Neisseria meningitidis NOT DETECTED NOT DETECTED Final   Pseudomonas aeruginosa NOT DETECTED NOT DETECTED Final   Candida albicans NOT DETECTED NOT DETECTED Final   Candida glabrata NOT DETECTED NOT DETECTED Final   Candida krusei NOT DETECTED NOT DETECTED Final   Candida parapsilosis NOT DETECTED NOT DETECTED Final   Candida tropicalis NOT DETECTED NOT DETECTED Final  MRSA PCR Screening     Status: None   Collection Time: 11/28/16  5:36 PM  Result Value Ref Range Status   MRSA by PCR NEGATIVE NEGATIVE Final    Comment:        The GeneXpert MRSA Assay (FDA approved for NASAL specimens only), is one component of a comprehensive MRSA colonization surveillance program. It is not intended to diagnose MRSA infection nor to guide or monitor treatment for MRSA infections.   Culture, blood (single) w Reflex to ID Panel     Status: None (Preliminary result)   Collection Time: 11/30/16  5:43 PM  Result Value Ref Range Status   Specimen Description BLOOD LEFT AC  Final   Special Requests BOTTLES DRAWN AEROBIC AND ANAEROBIC LEFT AC  Final   Culture NO GROWTH < 24 HOURS  Final   Report Status PENDING  Incomplete    RADIOLOGY:  No results found.   Management plans discussed with the patient, family and they are in agreement.  CODE STATUS:     Code Status Orders        Start     Ordered   11/28/16 1652  Full code  Continuous     11/28/16 1651    Code Status History    Date Active Date Inactive Code Status Order ID Comments User Context   This patient has a current code status but no historical code status.      TOTAL TIME TAKING CARE OF THIS PATIENT:40* minutes.    Saia Derossett M.D on 12/01/2016 at 9:02 AM  Between 7am to 6pm - Pager -  [phone] After 6pm go to [email or web address] - password [email or web address] [facility]  [place]  [facility]  Office  [phone]  CC: Primary care physician; No PCP Per Patient

## 2016-12-01 NOTE — Progress Notes (Signed)
12/01/2016 11:02 AM  BP (!) 167/90 (BP Location: Left Arm)   Pulse 87   Temp 98.1 F (36.7 C) (Oral)   Resp (!) 8   Ht 5\' 8"  (1.727 m)   Wt 60.3 kg (133 lb)   SpO2 100%   BMI 20.22 kg/m  Patient discharged per MD orders. Discharge instructions reviewed with patient and patient verbalized understanding. IV's removed per policy. Prescriptions discussed and given to patient. Discharged via wheelchair escorted by nursing staff.  Ron ParkerHerron, Baily Hovanec D, RN

## 2016-12-05 LAB — CULTURE, BLOOD (SINGLE): CULTURE: NO GROWTH

## 2016-12-21 ENCOUNTER — Ambulatory Visit: Payer: Medicaid Other | Admitting: Gastroenterology

## 2017-02-02 ENCOUNTER — Emergency Department
Admission: EM | Admit: 2017-02-02 | Discharge: 2017-02-02 | Disposition: A | Discharge status: Home / Self Care | Payer: Medicaid Other | Attending: Emergency Medicine | Admitting: Emergency Medicine

## 2017-02-02 DIAGNOSIS — Z79899 Other long term (current) drug therapy: Secondary | ICD-10-CM | POA: Insufficient documentation

## 2017-02-02 DIAGNOSIS — Z794 Long term (current) use of insulin: Secondary | ICD-10-CM | POA: Diagnosis not present

## 2017-02-02 DIAGNOSIS — E119 Type 2 diabetes mellitus without complications: Secondary | ICD-10-CM | POA: Diagnosis not present

## 2017-02-02 DIAGNOSIS — N390 Urinary tract infection, site not specified: Secondary | ICD-10-CM | POA: Insufficient documentation

## 2017-02-02 DIAGNOSIS — R1030 Lower abdominal pain, unspecified: Secondary | ICD-10-CM | POA: Diagnosis present

## 2017-02-02 DIAGNOSIS — F17200 Nicotine dependence, unspecified, uncomplicated: Secondary | ICD-10-CM | POA: Insufficient documentation

## 2017-02-02 LAB — URINALYSIS, COMPLETE (UACMP) WITH MICROSCOPIC
BACTERIA UA: NONE SEEN
Bilirubin Urine: NEGATIVE
Glucose, UA: >=500 mg/dL — AB
Ketones, ur: NEGATIVE mg/dL
NITRITE: POSITIVE — AB
Protein, ur: 100 mg/dL — AB
SPECIFIC GRAVITY, URINE: 1.015 (ref 1.005–1.030)
pH: 6 (ref 5.0–8.0)

## 2017-02-02 LAB — CBC
HEMATOCRIT: 38.3 % — AB (ref 40.0–52.0)
Hemoglobin: 12.8 g/dL — ABNORMAL LOW (ref 13.0–18.0)
MCH: 31.4 pg (ref 26.0–34.0)
MCHC: 33.4 g/dL (ref 32.0–36.0)
MCV: 94 fL (ref 80.0–100.0)
Platelets: 233 10*3/uL (ref 150–440)
RBC: 4.08 MIL/uL — ABNORMAL LOW (ref 4.40–5.90)
RDW: 14.7 % — AB (ref 11.5–14.5)
WBC: 7 10*3/uL (ref 3.8–10.6)

## 2017-02-02 LAB — BASIC METABOLIC PANEL
Anion gap: 4 — ABNORMAL LOW (ref 5–15)
BUN: 7 mg/dL (ref 6–20)
CO2: 27 mmol/L (ref 22–32)
Calcium: 8.7 mg/dL — ABNORMAL LOW (ref 8.9–10.3)
Chloride: 104 mmol/L (ref 101–111)
Creatinine, Ser: 1.07 mg/dL (ref 0.61–1.24)
GFR calc Af Amer: >60 mL/min (ref >60)
GLUCOSE: 349 mg/dL — AB (ref 65–99)
POTASSIUM: 3.4 mmol/L — AB (ref 3.5–5.1)
Sodium: 135 mmol/L (ref 135–145)

## 2017-02-02 LAB — GLUCOSE, CAPILLARY: GLUCOSE-CAPILLARY: 323 mg/dL — AB (ref 65–99)

## 2017-02-02 MED ORDER — CEPHALEXIN 500 MG PO CAPS: 500.0000 mg | ORAL_CAPSULE | Freq: Three times a day (TID) | ORAL | 0 refills | Status: AC

## 2017-02-02 MED ORDER — CEPHALEXIN 500 MG PO CAPS
500.0000 mg | ORAL_CAPSULE | Freq: Once | ORAL | Status: AC
  Administered 2017-02-02: 500 mg via ORAL
  Filled 2017-02-02: qty 1

## 2017-02-02 NOTE — ED Notes (Signed)
AAOx3.  Skin warm and dry.  Ambulates with easy and steady gait. NAD 

## 2017-02-02 NOTE — Discharge Instructions (Signed)
Please keep close tabs on your sugar levels as any infection can really a blood glucose levels. Please drink plenty of fluids and contact the [facility] urology for any catheter difficulties. Return here to emergency department especially if he develop a fever, blood sugar is out of control and rema.bqdischarge ins consistently over 300 regardless of medication., Focal weakness, persistent vomiting, or any other new concerns

## 2017-02-02 NOTE — ED Triage Notes (Signed)
Pt arrives to ER via POV c/o weakness and fatigue X 2 days. Pt reports high blood pressure at home. Pt denies pain in any specific area. Pt alert and oriented X4, active, cooperative, pt in NAD. RR even and unlabored, color WNL.

## 2017-02-02 NOTE — ED Provider Notes (Signed)
Time Seen: Approximately 1713 I have reviewed the triage notes  Chief Complaint: Weakness   History of Present Illness: Timothy Clayton is a 51 y.o. male *who states he's had some generalized weakness and fatigue over the last 2 days. Weakness is nonfocal and is been able to ambulate, eat, etc. Patient's having some lower abdominal discomfort and points to the area of the suprapubic catheter. He states his main concern is that his suprapubic catheter is not draining as well as it normally does. He states he is getting some urine output but it seems to be somewhat sluggish in nature. Rates he normally takes his medication for diabetes at night and has not had any medication today when he denies any fever. His only pain is just a period where the suprapubic site is located. He states that the catheter is [facility].   Past Medical History:  Diagnosis Date  . Diabetes mellitus without complication (HCC)   . Pancreatitis   . Suprapubic catheter [facility](HCC)     Patient Active Problem List   Diagnosis Date Noted  . Colitis   . Generalized abdominal pain   . Alcohol-induced chronic pancreatitis (HCC)   . Sepsis (HCC) 11/28/2016    History reviewed. No pertinent surgical history.  History reviewed. No pertinent surgical history.  Current Outpatient Rx  . Order #: [id number] Class: Historical Med  . Order #: [id number] Class: Normal  . Order #: [id number] Class: Print  . Order #: [id number] Class: Historical Med  . Order #: [id number] Class: Historical Med  . Order #: [id number] Class: Historical Med  . Order #: [id number] Class: Historical Med  . Order #: [id number] Class: Historical Med  . Order #: [id number] Class: Historical Med  . Order #: [id number] Class: Historical Med  . Order #: [id number] Class: Historical Med    Allergies:  Patient has no known allergies.  Family History: No family history on file.  Social History: Social History  Substance Use Topics  .  Smoking status: Current Every Day Smoker  . Smokeless tobacco: Never Used  . Alcohol use No     Review of Systems:   10 point review of systems was performed and was otherwise negative:  Constitutional: No fever Eyes: No visual disturbances ENT: No sore throat, ear pain Cardiac: No chest pain Respiratory: No shortness of breath, wheezing, or stridor Abdomen: Mild abdominal pain at the suprapubic site without any nausea, vomiting, diarrhea, right side abdominal pain or any other concerns Endocrine: No weight loss, No night sweats Extremities: No peripheral edema, cyanosis Skin: No rashes, easy bruising Neurologic: No focal weakness, trouble with speech or swollowing Urologic: No dysuria, Hematuria, or urinary frequency Delayed urine drainage and the leg bag   Physical Exam:  ED Triage Vitals  Enc Vitals Group     BP 02/02/17 1537 (!) 165/91     Pulse Rate 02/02/17 1537 (!) 105     Resp 02/02/17 1537 18     Temp 02/02/17 1537 98.4 F (36.9 C)     Temp Source 02/02/17 1537 Oral     SpO2 02/02/17 1537 95 %     Weight 02/02/17 1537 132 lb (59.9 kg)     Height 02/02/17 1537 5\' 8"  (1.727 m)     Head Circumference --      Peak Flow --      Pain Score 02/02/17 1804 0     Pain Loc --      Pain Edu? --      Excl. in GC? --  General: Awake , Alert , and Oriented times 3; GCS 15 Head: Normal cephalic , atraumatic Eyes: Pupils equal , round, reactive to light Nose/Throat: No nasal drainage, patent upper airway without erythema or exudate.  Neck: Supple, Full range of motion, No anterior adenopathy or palpable thyroid masses Lungs: Clear to ascultation without wheezes , rhonchi, or rales Heart: Regular rate, regular rhythm without murmurs , gallops , or rubs Abdomen:Mild discomfort over the lower middle abdominal region without rebound, guarding , or rigidity; bowel sounds positive and symmetric in all 4 quadrants. No organomegaly .   No focal tenderness over McBurney's point,  negative Murphy's sign   Extremities: 2 plus symmetric pulses. No edema, clubbing or cyanosis Neurologic: normal ambulation, Motor symmetric without deficits, sensory intact Skin: warm, dry, no rashes   Labs:   All laboratory work was reviewed including any pertinent negatives or positives listed below:  Labs Reviewed  BASIC METABOLIC PANEL - Abnormal; Notable for the following:       Result Value   Potassium 3.4 (*)    Glucose, Bld 349 (*)    Calcium 8.7 (*)    Anion gap 4 (*)    All other components within normal limits  CBC - Abnormal; Notable for the following:    RBC 4.08 (*)    Hemoglobin 12.8 (*)    HCT 38.3 (*)    RDW 14.7 (*)    All other components within normal limits  URINALYSIS, COMPLETE (UACMP) WITH MICROSCOPIC - Abnormal; Notable for the following:    Color, Urine YELLOW (*)    APPearance CLOUDY (*)    Glucose, UA >=500 (*)    Hgb urine dipstick MODERATE (*)    Protein, ur 100 (*)    Nitrite POSITIVE (*)    Leukocytes, UA LARGE (*)    Squamous Epithelial / LPF 0-5 (*)    All other components within normal limits  GLUCOSE, CAPILLARY - Abnormal; Notable for the following:    Glucose-Capillary 323 (*)    All other components within normal limits  URINE CULTURE  CBG MONITORING, ED  Urine culture was added after return of his urinalysis showed significant signs of urinary tract infection from fresh urine that had just recently drained into the bag.   EKG: * ED ECG REPORT I, Jennye Moccasin, the attending physician, personally viewed and interpreted this ECG.  Date: 02/02/2017 EKG Time: 1534 Rate: 105 Rhythm: Sinus tachycardia QRS Axis: normal Intervals: Incomplete right bundle branch block ST/T Wave abnormalities: normal Conduction Disturbances: none Narrative Interpretation: unremarkable No acute ischemic changes    ED Course: * Patient's stay here was uneventful and it appears that he has a urinary tract infection which is driven the blood glucose  level slightly elevated. I felt if we started the patient on oral antibiotics that we can get him to the urologist and if they feel necessary they can change out his catheter site. Be of understanding and doesn't express any focal weakness or any concerns for cardiovascular or cerebrovascular disease. His abdominal pain is very mild over the suprapubic site and I felt was unlikely to be a surgical abdomen at this time     Final Clinical Impression:  Final diagnoses:  Lower urinary tract infectious disease     Plan:  Outpatient " Discharge Medication List as of 02/02/2017  5:58 PM    START taking these medications   Details  cephALEXin (KEFLEX) 500 MG capsule Take 1 capsule (500 mg total) by mouth 3 (three)  times daily., Starting Wed 02/02/2017, Until Wed 02/09/2017, Print      " Patient was advised to return immediately if condition worsens. Patient was advised to follow up with their primary care physician or other specialized physicians involved in their outpatient care. The patient and/or family member/power of attorney had laboratory results reviewed at the bedside. All questions and concerns were addressed and appropriate discharge instructions were distributed by the nursing staff.           Jennye Moccasin, MD 02/02/17 [phone]

## 2017-02-06 LAB — URINE CULTURE

## 2017-02-07 NOTE — Progress Notes (Signed)
ED Antimicrobial Stewardship Positive Culture Follow Up   Timothy FarrierGabriel Clayton is an 51 y.o. male who [facility] with a chief complaint of  Chief Complaint  Patient presents with  . Weakness    Recent Results (from the past 720 hour(s))  Urine culture     Status: Abnormal   Collection Time: 02/02/17  5:23 PM  Result Value Ref Range Status   Specimen Description URINE, SUPRAPUBIC  Final   Special Requests NONE  Final   Culture (A)  Final    >=100,000 COLONIES/mL ESCHERICHIA COLI Confirmed Extended Spectrum Beta-Lactamase Producer (ESBL) >=100,000 COLONIES/mL PROTEUS MIRABILIS    Report Status 02/06/2017 FINAL  Final   Organism ID, Bacteria ESCHERICHIA COLI (A)  Final   Organism ID, Bacteria PROTEUS MIRABILIS (A)  Final      Susceptibility   Escherichia coli - MIC*    AMPICILLIN >=32 RESISTANT Resistant     CEFAZOLIN >=64 RESISTANT Resistant     CEFTRIAXONE >=64 RESISTANT Resistant     CIPROFLOXACIN >=4 RESISTANT Resistant     GENTAMICIN <=1 SENSITIVE Sensitive     IMIPENEM <=0.25 SENSITIVE Sensitive     NITROFURANTOIN <=16 SENSITIVE Sensitive     TRIMETH/SULFA >=320 RESISTANT Resistant     AMPICILLIN/SULBACTAM >=32 RESISTANT Resistant     PIP/TAZO <=4 SENSITIVE Sensitive     Extended ESBL POSITIVE Resistant     * >=100,000 COLONIES/mL ESCHERICHIA COLI   Proteus mirabilis - MIC*    AMPICILLIN <=2 SENSITIVE Sensitive     CEFAZOLIN <=4 SENSITIVE Sensitive     CEFTRIAXONE <=1 SENSITIVE Sensitive     CIPROFLOXACIN <=0.25 SENSITIVE Sensitive     GENTAMICIN <=1 SENSITIVE Sensitive     IMIPENEM 4 SENSITIVE Sensitive     NITROFURANTOIN 256 RESISTANT Resistant     TRIMETH/SULFA <=20 SENSITIVE Sensitive     AMPICILLIN/SULBACTAM <=2 SENSITIVE Sensitive     PIP/TAZO <=4 SENSITIVE Sensitive     * >=100,000 COLONIES/mL PROTEUS MIRABILIS    [x]  Treated with keflex, organism resistant to prescribed antimicrobial []  Patient discharged originally without  antimicrobial agent and treatment is now indicated  New antibiotic prescription: Macrobid 100 mg bid for 7 days from 2/19 in addition to keflex Rx called [name] on [address]  ED Provider: Birdena CrandallKinner  Demaree Liberto, PharmD, BCPS Clinical Pharmacist  02/07/2017

## 2017-02-22 ENCOUNTER — Ambulatory Visit: Payer: Medicaid Other

## 2017-10-15 ENCOUNTER — Encounter: Payer: Self-pay | Admitting: Emergency Medicine

## 2017-10-15 ENCOUNTER — Emergency Department: Payer: Medicaid Other

## 2017-10-15 ENCOUNTER — Emergency Department
Admission: EM | Admit: 2017-10-15 | Discharge: 2017-10-15 | Disposition: A | Discharge status: Home / Self Care | Payer: Medicaid Other | Attending: Emergency Medicine | Admitting: Emergency Medicine

## 2017-10-15 DIAGNOSIS — Z79899 Other long term (current) drug therapy: Secondary | ICD-10-CM | POA: Diagnosis not present

## 2017-10-15 DIAGNOSIS — F17200 Nicotine dependence, unspecified, uncomplicated: Secondary | ICD-10-CM | POA: Diagnosis not present

## 2017-10-15 DIAGNOSIS — E119 Type 2 diabetes mellitus without complications: Secondary | ICD-10-CM | POA: Insufficient documentation

## 2017-10-15 DIAGNOSIS — M79602 Pain in left arm: Secondary | ICD-10-CM | POA: Diagnosis present

## 2017-10-15 DIAGNOSIS — M25512 Pain in left shoulder: Secondary | ICD-10-CM | POA: Insufficient documentation

## 2017-10-15 DIAGNOSIS — Z794 Long term (current) use of insulin: Secondary | ICD-10-CM | POA: Diagnosis not present

## 2017-10-15 DIAGNOSIS — R079 Chest pain, unspecified: Secondary | ICD-10-CM | POA: Diagnosis not present

## 2017-10-15 DIAGNOSIS — I10 Essential (primary) hypertension: Secondary | ICD-10-CM | POA: Diagnosis not present

## 2017-10-15 LAB — CBC
HCT: 36.5 % — ABNORMAL LOW (ref 40.0–52.0)
Hemoglobin: 12.5 g/dL — ABNORMAL LOW (ref 13.0–18.0)
MCH: 31.9 pg (ref 26.0–34.0)
MCHC: 34.2 g/dL (ref 32.0–36.0)
MCV: 93.3 fL (ref 80.0–100.0)
PLATELETS: 245 10*3/uL (ref 150–440)
RBC: 3.92 MIL/uL — AB (ref 4.40–5.90)
RDW: 15.1 % — AB (ref 11.5–14.5)
WBC: 6.5 10*3/uL (ref 3.8–10.6)

## 2017-10-15 LAB — BASIC METABOLIC PANEL
Anion gap: 8 (ref 5–15)
BUN: 13 mg/dL (ref 6–20)
CALCIUM: 8.3 mg/dL — AB (ref 8.9–10.3)
CHLORIDE: 106 mmol/L (ref 101–111)
CO2: 27 mmol/L (ref 22–32)
Creatinine, Ser: 0.94 mg/dL (ref 0.61–1.24)
GFR calc Af Amer: >60 mL/min (ref >60)
GFR calc non Af Amer: >60 mL/min (ref >60)
GLUCOSE: 194 mg/dL — AB (ref 65–99)
Potassium: 3.6 mmol/L (ref 3.5–5.1)
Sodium: 141 mmol/L (ref 135–145)

## 2017-10-15 LAB — TROPONIN I

## 2017-10-15 NOTE — Discharge Instructions (Signed)
You have been seen in the emergency department today for left arm pain and chest pain. Your workup has shown normal results. As we discussed please follow-up with your primary care physician in the next 1-2 days for recheck. Return to the emergency department for any further chest pain, trouble breathing, or any other symptom personally concerning to yourself.  Please call the number provided for cardiology to arrange a follow up appointment and possible stress test as soon as possible.

## 2017-10-15 NOTE — ED Triage Notes (Signed)
Pt arrives ambulatory to triage with c/o left arm x 3 days. Pt does not report the pain radiating anywhere but in the arm. Pt is in NAD.

## 2017-10-15 NOTE — ED Provider Notes (Signed)
[facility] Emergency Department Provider Note  Time seen: 7:26 AM  I have reviewed the triage vital signs and the nursing notes.   HISTORY  Chief Complaint Arm Pain    HPI Timothy Clayton is a 51 y.o. male with a past medical history of diabetes, hypertension, chronic pancreatitis, presents to the emergency department for left arm and chest pain.  According to the patient for the past 5 days he has been experiencing pain in his left shoulder which goes down to his left elbow and occasionally into his chest.  States the pain is much worse with any movement of the shoulder.  Denies any inciting injury.  Patient also states mild shortness of breath worse when lying flat.  Denies any leg pain or swelling.  Denies any nausea or diaphoresis.  Patient does state upper abdominal pain but states this is fairly chronic due to his pancreatitis.  Denies any acute worsening.  Past Medical History:  Diagnosis Date  . Diabetes mellitus without complication (HCC)   . Pancreatitis   . Suprapubic catheter [facility](HCC)     Patient Active Problem List   Diagnosis Date Noted  . Colitis   . Generalized abdominal pain   . Alcohol-induced chronic pancreatitis (HCC)   . Sepsis (HCC) 11/28/2016    History reviewed. No pertinent surgical history.  Prior to Admission medications   Medication Sig Start Date End Date Taking? Authorizing Provider  acetaminophen (TYLENOL) 500 MG tablet Take 500 mg by mouth every 6 (six) hours as needed for moderate pain.     [provider]  amoxicillin-clavulanate (AUGMENTIN) 875-125 MG tablet Take 1 tablet by mouth every 12 (twelve) hours. 12/01/16   Enedina FinnerPatel, Sona, MD  cilostazol (PLETAL) 100 MG tablet Take 100 mg by mouth 2 (two) times daily.    [provider]  insulin glargine (LANTUS) 100 UNIT/ML injection Inject 12 Units into the skin at bedtime.    [provider]  mirtazapine (REMERON) 15 MG tablet Take 15 mg by mouth at  bedtime.    [provider]  omeprazole (PRILOSEC) 20 MG capsule Take 20 mg by mouth daily.    [provider]  OXcarbazepine (TRILEPTAL) 150 MG tablet Take 150 mg by mouth at bedtime.    [provider]  oxyCODONE-acetaminophen (PERCOCET) 7.5-325 MG tablet Take 1 tablet by mouth every 8 (eight) hours as needed for severe pain.     [provider]  Pancrelipase, Lip-Prot-Amyl, 6000 units CPEP Take 6,000 Units by mouth 3 (three) times daily with meals.    [provider]  Vitamin D, Ergocalciferol, (DRISDOL) 50000 units CAPS capsule Take 50,000 Units by mouth 2 (two) times a week. On Tuesday and Thursday    [provider]    No Known Allergies  No family history on file.  Social History Social History  Substance Use Topics  . Smoking status: Current Every Day Smoker  . Smokeless tobacco: Never Used  . Alcohol use No    Review of Systems Constitutional: Negative for fever. Cardiovascular: Intermittent left-sided chest pain times 5 days Respiratory: Mild shortness of breath, worse when lying flat Gastrointestinal: Negative for abdominal pain Musculoskeletal: Negative for leg pain or swelling. Neurological: Negative for headache All other ROS negative  ____________________________________________   PHYSICAL EXAM:  VITAL SIGNS: ED Triage Vitals  Enc Vitals Group     BP 10/15/17 0628 (!) 151/100     Pulse Rate 10/15/17 0628 93     Resp 10/15/17 0628 20  Temp 10/15/17 0628 98.1 F (36.7 C)     Temp Source 10/15/17 0628 Oral     SpO2 10/15/17 0628 98 %     Weight 10/15/17 0628 135 lb (61.2 kg)     Height 10/15/17 0628 5\' 8"  (1.727 m)     Head Circumference --      Peak Flow --      Pain Score 10/15/17 0627 7     Pain Loc --      Pain Edu? --      Excl. in GC? --     Constitutional: Alert and oriented. Well appearing and in no distress. Eyes: Normal exam ENT   Head: Normocephalic and atraumatic.    Mouth/Throat: Mucous membranes are moist. Cardiovascular: Normal rate, regular rhythm. No murmur Respiratory: Normal respiratory effort without tachypnea nor retractions. Breath sounds are clear  Gastrointestinal: Soft and nontender. No distention.   Musculoskeletal: Lower extremities are normal, no tenderness or edema.  Moderate tenderness of the left shoulder, left trapezius muscle.  Worse with movement of the left shoulder.  However the patient does have good movement, no concern for fracture or dislocation.  Neurovascular intact distally. Neurologic:  Normal speech and language. No gross focal neurologic deficits Skin:  Skin is warm, dry and intact.  Psychiatric: Mood and affect are normal.   ____________________________________________    EKG  EKG reviewed and interpreted by myself shows normal sinus rhythm at 95 bpm with a borderline widened QRS, normal axis, normal intervals, nonspecific ST changes without elevation.  ____________________________________________    RADIOLOGY  Chest x-ray negative for acute abnormality  ____________________________________________   INITIAL IMPRESSION / ASSESSMENT AND PLAN / ED COURSE  Pertinent labs & imaging results that were available during my care of the patient were reviewed by me and considered in my medical decision making (see chart for details).  Patient presents to the emergency department for 5 days of left shoulder pain occasional left chest pain shortness of breath when lying flat.  Differential would include ACS, CHF, musculoskeletal pain/arthritis.  We will check labs including cardiac enzymes, obtain a chest x-ray and continue to closely monitor.  Patient agreeable to plan.  Is largely normal.  Labs are negative including normal troponin.  X-ray shows no acute abnormalities.  Patient symptoms are very suggestive of musculoskeletal pain.  Patient is on chronic pain medication.  We will discharge from the emergency department  with PCP follow-up.  Patient agreeable to this plan of care.  ____________________________________________   FINAL CLINICAL IMPRESSION(S) / ED DIAGNOSES  Left shoulder pain Chest pain    Minna Antis, MD 10/15/17 [phone]

## 2017-10-15 NOTE — ED Notes (Signed)
E-signature pad not working, went over discharge paperwork with pt and son

## 2017-10-15 NOTE — ED Notes (Signed)
Patient c/o left arm pain radiating shoulder/back and left chest. Patient c/o SOB, nausea, weakness. Patient reports SOB  Worsens when laying down flat in bed.

## 2018-05-29 ENCOUNTER — Other Ambulatory Visit: Payer: Self-pay

## 2018-05-29 ENCOUNTER — Emergency Department
Admission: EM | Admit: 2018-05-29 | Discharge: 2018-05-29 | Disposition: A | Discharge status: Home / Self Care | Payer: Medicaid Other | Attending: Emergency Medicine | Admitting: Emergency Medicine

## 2018-05-29 ENCOUNTER — Emergency Department: Payer: Medicaid Other

## 2018-05-29 DIAGNOSIS — Z79899 Other long term (current) drug therapy: Secondary | ICD-10-CM | POA: Insufficient documentation

## 2018-05-29 DIAGNOSIS — E119 Type 2 diabetes mellitus without complications: Secondary | ICD-10-CM | POA: Diagnosis not present

## 2018-05-29 DIAGNOSIS — N451 Epididymitis: Secondary | ICD-10-CM | POA: Insufficient documentation

## 2018-05-29 DIAGNOSIS — R609 Edema, unspecified: Secondary | ICD-10-CM

## 2018-05-29 DIAGNOSIS — N50812 Left testicular pain: Secondary | ICD-10-CM | POA: Diagnosis present

## 2018-05-29 DIAGNOSIS — Z794 Long term (current) use of insulin: Secondary | ICD-10-CM | POA: Insufficient documentation

## 2018-05-29 DIAGNOSIS — N50819 Testicular pain, unspecified: Secondary | ICD-10-CM

## 2018-05-29 DIAGNOSIS — F17200 Nicotine dependence, unspecified, uncomplicated: Secondary | ICD-10-CM | POA: Insufficient documentation

## 2018-05-29 LAB — URINALYSIS, ROUTINE W REFLEX MICROSCOPIC
BILIRUBIN URINE: NEGATIVE
Glucose, UA: >=500 mg/dL — AB
KETONES UR: NEGATIVE mg/dL
NITRITE: POSITIVE — AB
PH: 7 (ref 5.0–8.0)
PROTEIN: 100 mg/dL — AB
RBC / HPF: >50 RBC/hpf — ABNORMAL HIGH (ref 0–5)
Specific Gravity, Urine: 1.016 (ref 1.005–1.030)
WBC, UA: >50 WBC/hpf — ABNORMAL HIGH (ref 0–5)

## 2018-05-29 MED ORDER — OXYCODONE-ACETAMINOPHEN 5-325 MG PO TABS
1.0000 | ORAL_TABLET | Freq: Once | ORAL | Status: AC
  Administered 2018-05-29: 1 via ORAL
  Filled 2018-05-29: qty 1

## 2018-05-29 MED ORDER — LEVOFLOXACIN 500 MG PO TABS: 500.0000 mg | ORAL_TABLET | Freq: Every day | ORAL | 0 refills | Status: AC

## 2018-05-29 MED ORDER — OXYCODONE-ACETAMINOPHEN 5-325 MG PO TABS: 1.0000 | ORAL_TABLET | ORAL | 0 refills | Status: DC | PRN

## 2018-05-29 NOTE — ED Notes (Signed)
Pt is being discharged to home. Pt is AOx4, VSS. AVS and prescriptions  were given and explained to the pt and he verbalized understanding.

## 2018-05-29 NOTE — Discharge Instructions (Signed)
Do not drink or drive on Percocet do not take with Tylenol, if you have increased pain, swelling, fever, vomiting, or worsening symptoms with any variety, return to the emergency room, please call your [facility] urologist today for an appointment as soon as possible.  [facility] until they are gone, I will give you four additional days to ensure that you can clear this.  This will be a total of 16 days of antibiotics.

## 2018-05-29 NOTE — ED Notes (Signed)
Pt states has been taking abx as prescribed (leavquin 1 QD x10 days, 6 doses taken at this point). Pt states left testicular swelling, pain worsening. Pt states 10/10 throbbing pain. Left testicle swollen, pt obviously uncomfortable.

## 2018-05-29 NOTE — ED Provider Notes (Addendum)
[facility] Emergency Department Provider Note  ____________________________________________   I have reviewed the triage vital signs and the nursing notes. Where available I have reviewed prior notes and, if possible and indicated, outside hospital notes.    HISTORY  Chief Complaint Testicle Pain    HPI Timothy Clayton is a 52 y.o. male  with a chronic spc who presents today c/o swelling to the L testes. No fever no trauma no n/v. Aside from isolated pain there no complaints. [facility] and states he was given abx.  Per notes he was given levaquin for 10 days. Has not completed them. Pain is sharp, constant. Nothing makes better x not touching. Worse when he walks or touches it.  He states he feels it might be slightly better since he saw [facility] he has not yet tried to make an appointment with his PCP he does have a ride home, is here for pain control.  He is still taking the antibiotics.  He has had no trauma.  No other antecedent interventions.  No radiation.  It was swelling.  States he has not had sexual activity since 2014     Past Medical History:  Diagnosis Date  . Diabetes mellitus without complication (HCC)   . Pancreatitis   . Suprapubic catheter [facility])     Patient Active Problem List   Diagnosis Date Noted  . Colitis   . Generalized abdominal pain   . Alcohol-induced chronic pancreatitis (HCC)   . Sepsis (HCC) 11/28/2016    No past surgical history on file.  Prior to Admission medications   Medication Sig Start Date End Date Taking? Authorizing Provider  acetaminophen (TYLENOL) 500 MG tablet Take 500 mg by mouth every 6 (six) hours as needed for moderate pain.     [provider]  amoxicillin-clavulanate (AUGMENTIN) 875-125 MG tablet Take 1 tablet by mouth every 12 (twelve) hours. 12/01/16   Enedina Finner, MD  cilostazol (PLETAL) 100 MG tablet Take 100 mg by mouth 2 (two) times daily.    [provider]   insulin glargine (LANTUS) 100 UNIT/ML injection Inject 12 Units into the skin at bedtime.    [provider]  mirtazapine (REMERON) 15 MG tablet Take 15 mg by mouth at bedtime.    [provider]  omeprazole (PRILOSEC) 20 MG capsule Take 20 mg by mouth daily.    [provider]  OXcarbazepine (TRILEPTAL) 150 MG tablet Take 150 mg by mouth at bedtime.    [provider]  oxyCODONE-acetaminophen (PERCOCET) 7.5-325 MG tablet Take 1 tablet by mouth every 8 (eight) hours as needed for severe pain.     [provider]  Pancrelipase, Lip-Prot-Amyl, 6000 units CPEP Take 6,000 Units by mouth 3 (three) times daily with meals.    [provider]  Vitamin D, Ergocalciferol, (DRISDOL) 50000 units CAPS capsule Take 50,000 Units by mouth 2 (two) times a week. On Tuesday and Thursday    [provider]    Allergies Patient has no known allergies.  No family history on file.  Social History Social History   Tobacco Use  . Smoking status: Current Every Day Smoker  . Smokeless tobacco: Never Used  Substance Use Topics  . Alcohol use: No  . Drug use: No    Review of Systems Constitutional: No fever/chills Eyes: No visual changes. ENT: No sore throat. No stiff neck no neck pain Cardiovascular: Denies chest pain. Respiratory: Denies shortness of breath. Gastrointestinal:  no vomiting.  No diarrhea.  No constipation. Genitourinary: Negative for dysuria. Musculoskeletal: Negative lower extremity swelling Skin: Negative for rash. Neurological: Negative for severe headaches, focal weakness or numbness.   ____________________________________________   PHYSICAL EXAM:  VITAL SIGNS: ED Triage Vitals  Enc Vitals Group     BP 05/29/18 0104 132/78     Pulse Rate 05/29/18 0104 88     Resp 05/29/18 0104 20     Temp 05/29/18 0104 98.2 F (36.8 C)     Temp Source 05/29/18 0104 Oral     SpO2 05/29/18 0104 100 %     Weight 05/29/18 0100  132 lb (59.9 kg)     Height 05/29/18 0100 5\' 8"  (1.727 m)     Head Circumference --      Peak Flow --      Pain Score 05/29/18 0625 10     Pain Loc --      Pain Edu? --      Excl. in GC? --     Constitutional: Alert and oriented. Well appearing and in no acute distress. Eyes: Conjunctivae are normal Head: Atraumatic HEENT: No congestion/rhinnorhea. Mucous membranes are moist.  Oropharynx non-erythematous Neck:   Nontender with no meningismus, no masses, no stridor Cardiovascular: Normal rate, regular rhythm. Grossly normal heart sounds.  Good peripheral circulation. Respiratory: Normal respiratory effort.  No retractions. Lungs CTAB. Abdominal: Soft and nontender. No distention. No guarding no rebound SPC in place and draining , Normal external male genitalia, there is tenderness and swelling to the left testicle, there is no induration of the scrotum or erythema or warmth to touch, there is intact cremasteric reflexes, there is no crepitus, nothing consistent with Fournier's gangrene, there is no lesions noted on the penis itself, Back:  There is no focal tenderness or step off.  there is no midline tenderness there are no lesions noted. there is no CVA tenderness Musculoskeletal: No lower extremity tenderness, no upper extremity tenderness. No joint effusions, no DVT signs strong distal pulses no edema Neurologic:  Normal speech and language. No gross focal neurologic deficits are appreciated.  Skin:  Skin is warm, dry and intact. No rash noted. Psychiatric: Mood and affect are normal. Speech and behavior are normal.  ____________________________________________   LABS (all labs ordered are listed, but only abnormal results are displayed)  Labs Reviewed  CHLAMYDIA/NGC RT PCR ([facility] ONLY)  URINE CULTURE  URINALYSIS, ROUTINE W REFLEX MICROSCOPIC    Pertinent labs  results that were available during my care of the patient were reviewed by me and considered in my medical  decision making (see chart for details). ____________________________________________  EKG  I personally interpreted any EKGs ordered by me or triage  ____________________________________________  RADIOLOGY  Pertinent labs & imaging results that were available during my care of the patient were reviewed by me and considered in my medical decision making (see chart for details). If possible, patient and/or family made aware of any abnormal findings.  [place] Scrotum  Result Date: 05/29/2018 CLINICAL DATA:  Initial evaluation for acute swelling with pain. EXAM: SCROTAL ULTRASOUND DOPPLER ULTRASOUND OF THE TESTICLES TECHNIQUE: Complete ultrasound examination of the testicles, epididymis, and other scrotal structures was performed. Color and spectral Doppler ultrasound were also utilized to evaluate blood flow to the testicles. COMPARISON:  Prior ultrasound from 08/18/2014. FINDINGS: Right testicle Measurements: 4.2 x 2.2 x 3.0 cm. No mass or microlithiasis visualized. Left testicle Measurements: 3.9 x 2.7 x 3.1 cm. No mass or microlithiasis visualized. Right epididymis:  Normal in size and appearance. 7 mm simple cyst noted at the right epididymal head, doubtful significance. Left epididymis: Left epididymis is enlarged and heterogeneous in appearance with increased vascularity, suggesting possible acute epididymitis. Prominent epididymal appendix noted. Hydrocele:  Large mildly complex left-sided hydrocele. Varicocele:  Small left-sided varicocele noted. Pulsed Doppler interrogation of both testes demonstrates normal low resistance arterial and venous waveforms bilaterally. IMPRESSION: 1. Enlargement with heterogeneous echotexture and increased vascularity involving the left epididymis, suggesting acute epididymitis. 2. Large mildly complex left-sided hydrocele, likely reactive. 3. No evidence for torsion or other acute testicular abnormality. No findings to suggest associated orchitis at this time. 4. Small  left-sided varicocele. Electronically Signed   By: Rise Mu M.D.   On: 05/29/2018 02:19   US Pelvic Doppler (torsion R/o Or Mass Arterial Flow)  Result Date: 05/29/2018 CLINICAL DATA:  Initial evaluation for acute swelling with pain. EXAM: SCROTAL ULTRASOUND DOPPLER ULTRASOUND OF THE TESTICLES TECHNIQUE: Complete ultrasound examination of the testicles, epididymis, and other scrotal structures was performed. Color and spectral Doppler ultrasound were also utilized to evaluate blood flow to the testicles. COMPARISON:  Prior ultrasound from 08/18/2014. FINDINGS: Right testicle Measurements: 4.2 x 2.2 x 3.0 cm. No mass or microlithiasis visualized. Left testicle Measurements: 3.9 x 2.7 x 3.1 cm. No mass or microlithiasis visualized. Right epididymis: Normal in size and appearance. 7 mm simple cyst noted at the right epididymal head, doubtful significance. Left epididymis: Left epididymis is enlarged and heterogeneous in appearance with increased vascularity, suggesting possible acute epididymitis. Prominent epididymal appendix noted. Hydrocele:  Large mildly complex left-sided hydrocele. Varicocele:  Small left-sided varicocele noted. Pulsed Doppler interrogation of both testes demonstrates normal low resistance arterial and venous waveforms bilaterally. IMPRESSION: 1. Enlargement with heterogeneous echotexture and increased vascularity involving the left epididymis, suggesting acute epididymitis. 2. Large mildly complex left-sided hydrocele, likely reactive. 3. No evidence for torsion or other acute testicular abnormality. No findings to suggest associated orchitis at this time. 4. Small left-sided varicocele. Electronically Signed   By: Rise Mu M.D.   On: 05/29/2018 02:19   ____________________________________________    PROCEDURES  Procedure(s) performed: None  Procedures  Critical Care performed: None  ____________________________________________   INITIAL IMPRESSION /  ASSESSMENT AND PLAN / ED COURSE  Pertinent labs & imaging results that were available during my care of the patient were reviewed by me and considered in my medical decision making (see chart for details).  Patient with epididymitis on day 5 of antibiotic treatment was continued swelling here for pain control.  He is not sexually active.  Levaquin is the appropriate choice for him.  Sometimes it takes longer than 5 days for this to resolve.  We will send him home with pain medications.  Patient does have a urologist at [facility], will advise close follow-up.  Nothing to suggest torsion, nothing to suggest systemic illness nothing to suggest that the patient requires STI treatment given that he has not had sexual activity he states in 4 to 5 years.  There is no evidence of Fournier's gangrene, there is no evidence of cellulitic changes or worsening symptoms and in fact patient states that somewhat improved he just would like pain medication.  We will send him home with pain medication as this looks uncomfortable, will stressed the need to continue taking antibiotics and see his urologist in the next day or 2.  He does have a ride home, he understands not to drive on [place].  ----------------------------------------- 8:03 AM on 05/29/2018 -----------------------------------------  Of  note, patient's blood pressure somewhat elevated here today, he is somewhat anxious and also has not taken his medications this morning, advised him to recheck that.  In addition, urine of course looks infected because of the suprapubic catheter, we are not going to change antibiotics for that reason but urine culture has been sent.     ____________________________________________   FINAL CLINICAL IMPRESSION(S) / ED DIAGNOSES  Final diagnoses:  Testicular pain  Swelling      This chart was dictated using voice recognition software.  Despite best efforts to proofread,  errors can occur which can change meaning.       Jeanmarie Plant, MD 05/29/18 [id number]    Jeanmarie Plant, MD 05/29/18 [id number]    Jeanmarie Plant, MD 05/29/18 [phone]

## 2018-05-29 NOTE — ED Notes (Signed)
Pt is AOx4, VSS, he is in bed with rails upx2, he c/o testicle pain at 10/10, he denies any chest pain, shortness of breath or dizziness. We will continue to monitor the pt.

## 2018-05-29 NOTE — ED Notes (Signed)
Chlamydia/NGC cancelled per verbal from [place]

## 2018-05-29 NOTE — ED Triage Notes (Signed)
Patient reports swelling and pain to scrotum for the past week.  Reports [facility] and given antibiotics, but that he is not feeling any better.

## 2018-05-31 LAB — URINE CULTURE

## 2018-06-01 NOTE — Progress Notes (Addendum)
ED Antimicrobial Stewardship Positive Culture Follow Up   Timothy FarrierGabriel Clayton is an 52 y.o. male who [facility] with a chief complaint of testicle pain/swelling. Patient has a suprapubic cathetor and history of UTI's. [facility] and given a course of levofloxacin. Was [facility] for U[facility] and urine culture growing E.Coli ESBL--resistant to levofloxacin. Contacted patient via phone and patient stated [facility] and [facility] and to follow up with patient.    [x]  Treated with levofloxacin and Bactrim, organism resistant to prescribed antimicrobials  New antibiotic prescription: Recommended fosfomycin 3g on days 1, 3 and 5     ED Provider: Rifenbark    Chief Complaint  Patient presents with  . Testicle Pain    Recent Results (from the past 720 hour(s))  Urine culture     Status: Abnormal   Collection Time: 05/29/18  7:26 AM  Result Value Ref Range Status   Specimen Description   Final    URINE, RANDOM Performed at [facility], [address] [place], [place] [address]    Special Requests   Final    NONE Performed at [facility], [address] [place], [place] [address]    Culture (A)  Final    >=100,000 COLONIES/mL ESCHERICHIA COLI Confirmed Extended Spectrum Beta-Lactamase Producer (ESBL).  In bloodstream infections from ESBL organisms, carbapenems are preferred over piperacillin/tazobactam. They are shown to have a lower risk of mortality. Performed at [facility], [address] [place], [place] [address]    Report Status 05/31/2018 FINAL  Final   Organism ID, Bacteria ESCHERICHIA COLI (A)  Final      Susceptibility   Escherichia coli - MIC*    AMPICILLIN >=32 RESISTANT Resistant     CEFAZOLIN >=64 RESISTANT Resistant     CEFTRIAXONE >=64 RESISTANT Resistant    CIPROFLOXACIN >=4 RESISTANT Resistant     GENTAMICIN <=1 SENSITIVE Sensitive     IMIPENEM <=0.25 SENSITIVE Sensitive     NITROFURANTOIN 64 INTERMEDIATE Intermediate     TRIMETH/SULFA >=320 RESISTANT Resistant     AMPICILLIN/SULBACTAM >=32 RESISTANT Resistant     PIP/TAZO <=4 SENSITIVE Sensitive     Extended ESBL POSITIVE Resistant     * >=100,000 COLONIES/mL ESCHERICHIA COLI     Timothy CedarStephanie Shaena Clayton, PharmD Pharmacy Resident  06/01/2018, 2:26 PM

## 2020-01-03 IMAGING — US US ART/VEN ABD/PELV/SCROTUM DOPPLER LTD
1 series · 13 of 25 positions shown · non-contrast
Comparison: Prior ultrasound from 08/18/2014.

CLINICAL DATA: Initial evaluation for acute swelling with pain.

EXAM:
SCROTAL ULTRASOUND
DOPPLER ULTRASOUND OF THE TESTICLES
TECHNIQUE: Complete ultrasound examination of the testicles, epididymis, and
other scrotal structures was performed. Color and spectral Doppler
ultrasound were also utilized to evaluate blood flow to the
testicles.

[Series 1: us art/ven abd/pelv/scrotum doppler ltd · 13 of 81 slices shown]
[im 1/81]
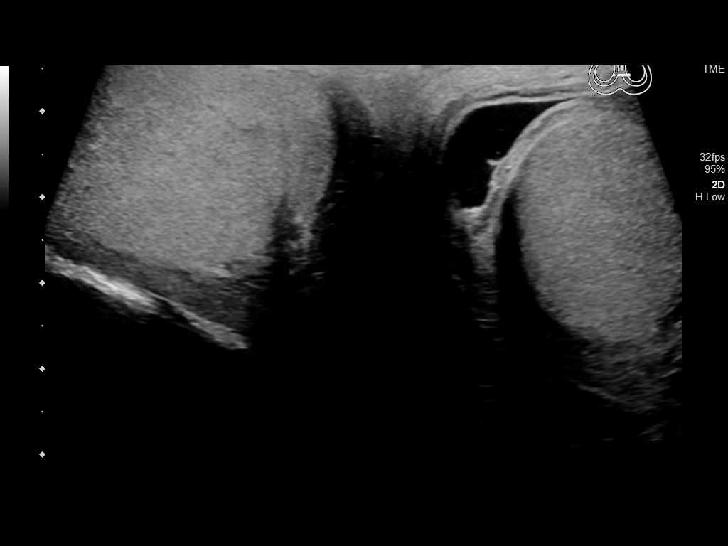
[im 7/81]
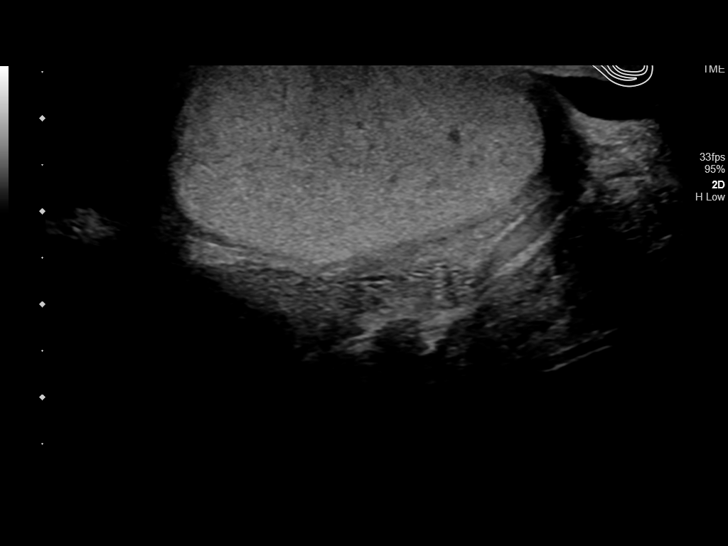
[im 14/81]
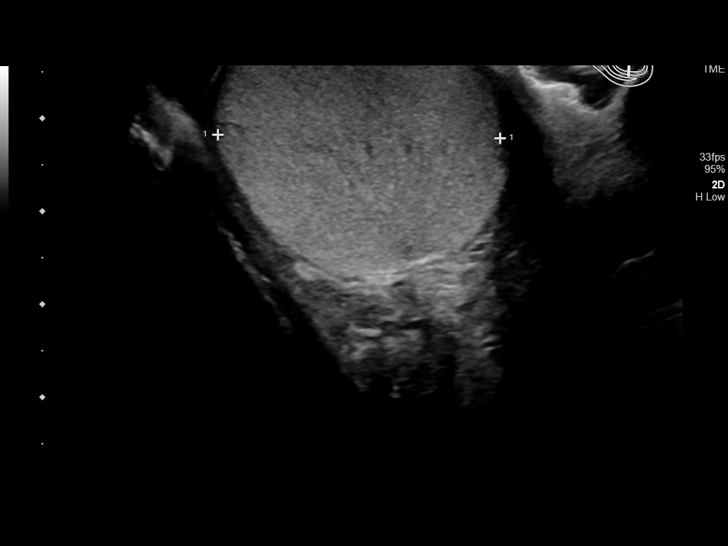
[im 21/81]
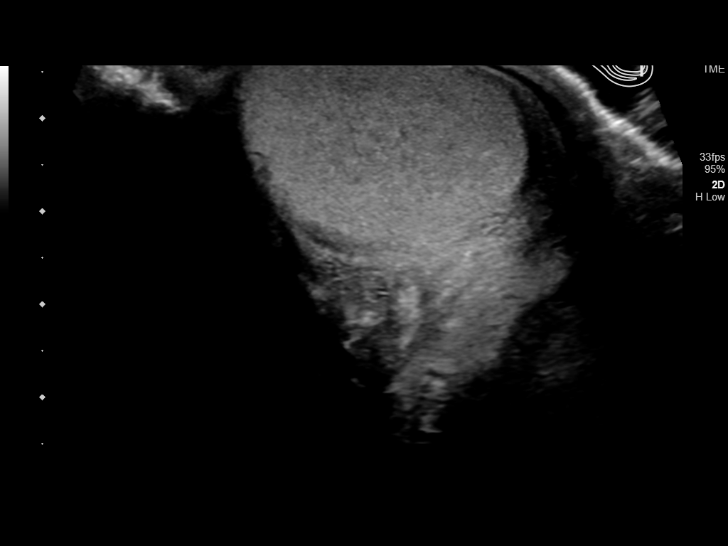
[im 27/81]
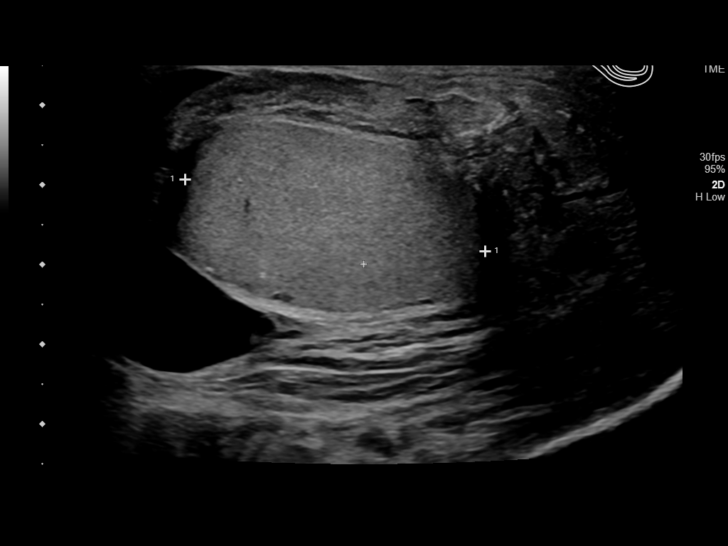
[im 34/81]
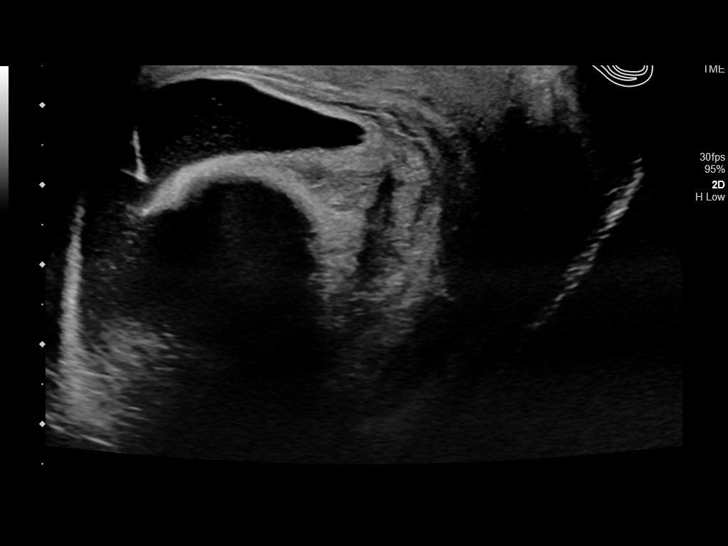
[im 41/81]
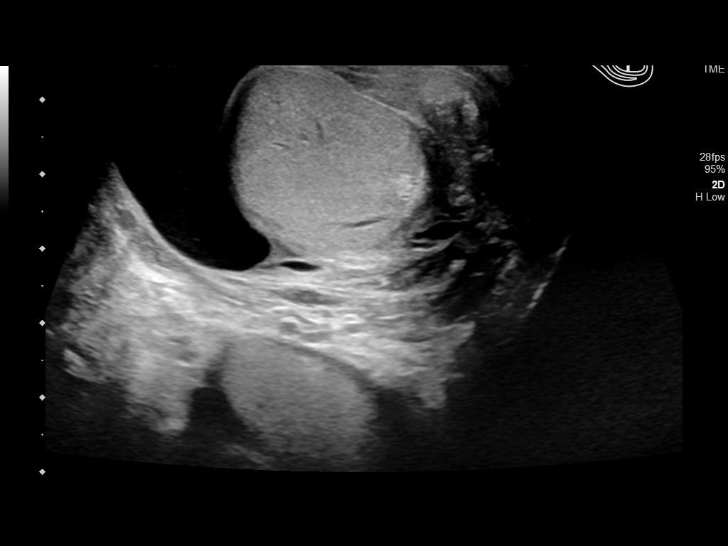
[im 47/81]
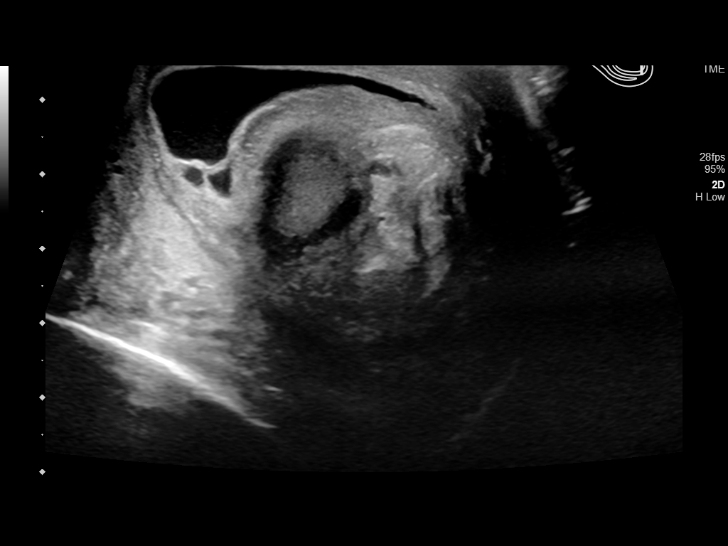
[im 54/81]
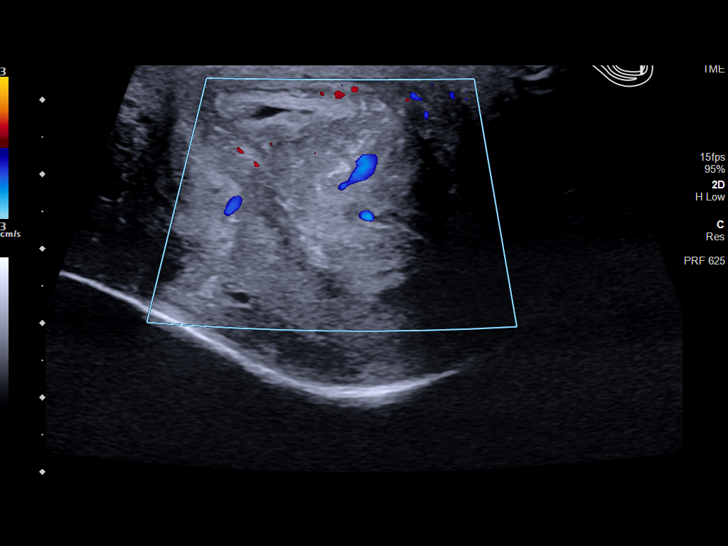
[im 61/81]
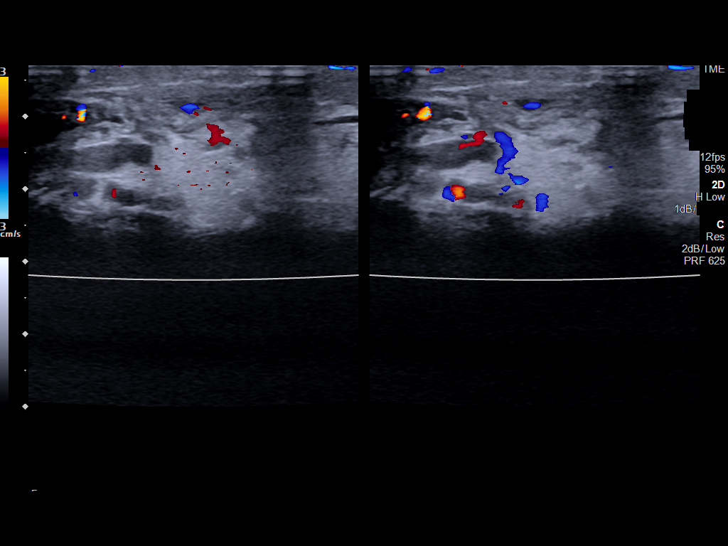
[im 67/81]
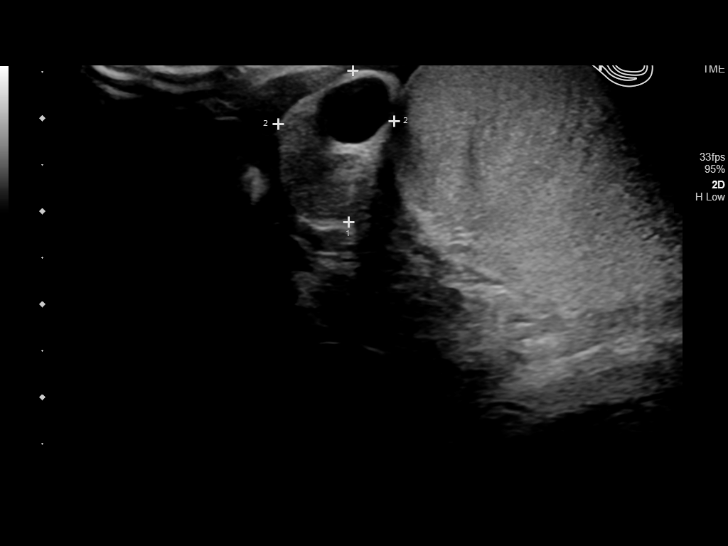
[im 74/81]
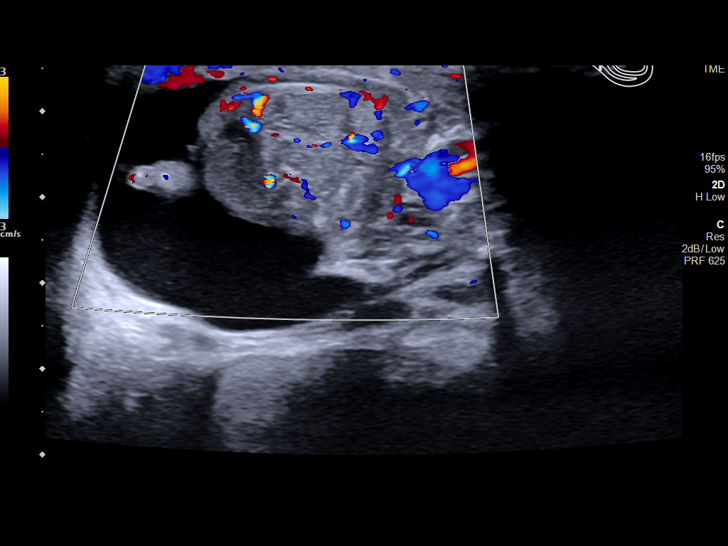
[im 81/81]
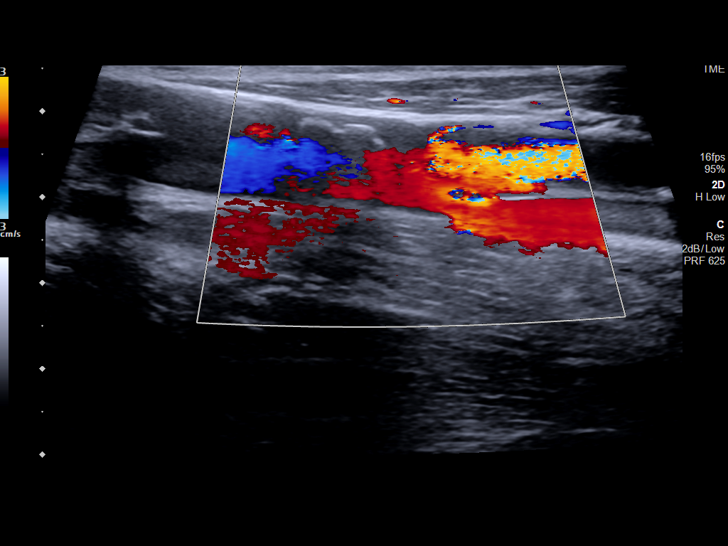

[13 of 25 positions shown; findings below may reference images not displayed]

FINDINGS: Right testicle

Measurements: 4.2 x 2.2 x 3.0 cm. No mass or microlithiasis
visualized.

Left testicle

Measurements: 3.9 x 2.7 x 3.1 cm. No mass or microlithiasis
visualized.

Right epididymis: Normal in size and appearance. 7 mm simple cyst
noted at the right epididymal head, doubtful significance.

Left epididymis: Left epididymis is enlarged and heterogeneous in
appearance with increased vascularity, suggesting possible acute
epididymitis. Prominent epididymal appendix noted.

Hydrocele:  Large mildly complex left-sided hydrocele.

Varicocele:  Small left-sided varicocele noted.

Pulsed Doppler interrogation of both testes demonstrates normal low
resistance arterial and venous waveforms bilaterally.
IMPRESSION: 1. Enlargement with heterogeneous echotexture and increased
vascularity involving the left epididymis, suggesting acute
epididymitis.
2. Large mildly complex left-sided hydrocele, likely reactive.
3. No evidence for torsion or other acute testicular abnormality. No
findings to suggest associated orchitis at this time.
4. Small left-sided varicocele.

## 2022-10-22 ENCOUNTER — Emergency Department
Admission: EM | Admit: 2022-10-22 | Discharge: 2022-10-22 | Discharge status: Against Medical Advice | Payer: Medicaid Other

## 2022-10-22 NOTE — ED Notes (Signed)
Pt to ED via [facility] from home for Abd pain pt has Hx/o pancreatitis and is having Catheter site pain without retention.  Vss per EMS.

## 2023-09-17 ENCOUNTER — Emergency Department
Admission: EM | Admit: 2023-09-17 | Discharge: 2023-09-17 | Disposition: A | Discharge status: Home / Self Care | Payer: Medicaid Other | Attending: Emergency Medicine | Admitting: Emergency Medicine

## 2023-09-17 DIAGNOSIS — E875 Hyperkalemia: Secondary | ICD-10-CM | POA: Insufficient documentation

## 2023-09-17 DIAGNOSIS — R739 Hyperglycemia, unspecified: Secondary | ICD-10-CM

## 2023-09-17 DIAGNOSIS — F419 Anxiety disorder, unspecified: Secondary | ICD-10-CM | POA: Diagnosis not present

## 2023-09-17 DIAGNOSIS — E1165 Type 2 diabetes mellitus with hyperglycemia: Secondary | ICD-10-CM | POA: Diagnosis not present

## 2023-09-17 DIAGNOSIS — Z794 Long term (current) use of insulin: Secondary | ICD-10-CM | POA: Insufficient documentation

## 2023-09-17 DIAGNOSIS — R531 Weakness: Secondary | ICD-10-CM | POA: Diagnosis not present

## 2023-09-17 LAB — COMPREHENSIVE METABOLIC PANEL
ALT: 37 U/L (ref 0–44)
AST: 32 U/L (ref 15–41)
Albumin: 3.4 g/dL — ABNORMAL LOW (ref 3.5–5.0)
Alkaline Phosphatase: 140 U/L — ABNORMAL HIGH (ref 38–126)
Anion gap: 11 (ref 5–15)
BUN: 33 mg/dL — ABNORMAL HIGH (ref 6–20)
CO2: 21 mmol/L — ABNORMAL LOW (ref 22–32)
Calcium: 8.4 mg/dL — ABNORMAL LOW (ref 8.9–10.3)
Chloride: 93 mmol/L — ABNORMAL LOW (ref 98–111)
Creatinine, Ser: 1.98 mg/dL — ABNORMAL HIGH (ref 0.61–1.24)
GFR, Estimated: 38 mL/min — ABNORMAL LOW (ref >60)
Glucose, Bld: 811 mg/dL (ref 70–99)
Potassium: 4.3 mmol/L (ref 3.5–5.1)
Sodium: 125 mmol/L — ABNORMAL LOW (ref 135–145)
Total Bilirubin: 0.6 mg/dL (ref 0.3–1.2)
Total Protein: 7.1 g/dL (ref 6.5–8.1)

## 2023-09-17 LAB — URINALYSIS, ROUTINE W REFLEX MICROSCOPIC
Bilirubin Urine: NEGATIVE
Glucose, UA: >=500 mg/dL — AB
Ketones, ur: NEGATIVE mg/dL
Nitrite: NEGATIVE
Protein, ur: NEGATIVE mg/dL
Specific Gravity, Urine: 1.023 (ref 1.005–1.030)
pH: 6 (ref 5.0–8.0)

## 2023-09-17 LAB — BETA-HYDROXYBUTYRIC ACID: Beta-Hydroxybutyric Acid: 0.18 mmol/L (ref 0.05–0.27)

## 2023-09-17 LAB — CBC WITH DIFFERENTIAL/PLATELET
Abs Immature Granulocytes: 0.03 10*3/uL (ref 0.00–0.07)
Basophils Absolute: 0.1 10*3/uL (ref 0.0–0.1)
Basophils Relative: 2 %
Eosinophils Absolute: 0.2 10*3/uL (ref 0.0–0.5)
Eosinophils Relative: 3 %
HCT: 44.2 % (ref 39.0–52.0)
Hemoglobin: 14.6 g/dL (ref 13.0–17.0)
Immature Granulocytes: 1 %
Lymphocytes Relative: 25 %
Lymphs Abs: 1.6 10*3/uL (ref 0.7–4.0)
MCH: 28.4 pg (ref 26.0–34.0)
MCHC: 33 g/dL (ref 30.0–36.0)
MCV: 86 fL (ref 80.0–100.0)
Monocytes Absolute: 0.3 10*3/uL (ref 0.1–1.0)
Monocytes Relative: 5 %
Neutro Abs: 4.3 10*3/uL (ref 1.7–7.7)
Neutrophils Relative %: 64 %
Platelets: 271 10*3/uL (ref 150–400)
RBC: 5.14 MIL/uL (ref 4.22–5.81)
RDW: 15.7 % — ABNORMAL HIGH (ref 11.5–15.5)
WBC: 6.5 10*3/uL (ref 4.0–10.5)
nRBC: 0 % (ref 0.0–0.2)

## 2023-09-17 LAB — CBG MONITORING, ED
Glucose-Capillary: >600 mg/dL (ref 70–99)
Glucose-Capillary: 273 mg/dL — ABNORMAL HIGH (ref 70–99)

## 2023-09-17 LAB — BLOOD GAS, VENOUS

## 2023-09-17 MED ORDER — LACTATED RINGERS IV BOLUS
1000.0000 mL | Freq: Once | INTRAVENOUS | Status: AC
  Administered 2023-09-17: 1000 mL via INTRAVENOUS

## 2023-09-17 MED ORDER — SODIUM CHLORIDE 0.9 % IV BOLUS: 1000.0000 mL | Freq: Once | INTRAVENOUS | Status: DC

## 2023-09-17 MED ORDER — LACTATED RINGERS IV BOLUS
20.0000 mL/kg | Freq: Once | INTRAVENOUS | Status: AC
  Administered 2023-09-17: 1332 mL via INTRAVENOUS

## 2023-09-17 MED ORDER — INSULIN ASPART 100 UNIT/ML IJ SOLN
10.0000 [IU] | Freq: Once | INTRAMUSCULAR | Status: AC
  Administered 2023-09-17: 10 [IU] via INTRAVENOUS
  Filled 2023-09-17: qty 1

## 2023-09-17 NOTE — ED Triage Notes (Signed)
Patient arrives by [facility] from home.  Patient had labs drawn at his PCP, and was advised that his potassium is high, and EMS reports CBG of 600.  Patient has not checked his  sugar or taken insulin today.  He says his CBG was 167 yesterday.   Patient reports anxiety.  He says he took anxiety meds today around 1300, but it hasn't worked.

## 2023-09-17 NOTE — ED Provider Notes (Signed)
[facility] Provider Note    Event Date/Time   First MD Initiated Contact with Patient 09/17/23 2010     (approximate)   History   Elevated potassium   HPI  Timothy Clayton is a 57 y.o. male who presents to the emergency department today because of concerns for hyperkalemia.  Patient had blood work done 5 days ago as part of routine doctor's appointment.  He was told that his potassium was elevated and that he should come to the emergency department.  The patient states that he has recently restarted his sertraline because he has been having some anxiety.  Since then he has had some weakness.  Otherwise he denies any recent fevers.  No chest pain or palpitations.  Patient does have history of diabetes.      Physical Exam   Triage Vital Signs: ED Triage Vitals  Encounter Vitals Group     BP 09/17/23 2005 110/66     Systolic BP Percentile --      Diastolic BP Percentile --      Pulse Rate 09/17/23 2005 88     Resp 09/17/23 2005 11     Temp 09/17/23 2005 99.4 F (37.4 C)     Temp Source 09/17/23 2005 Oral     SpO2 09/17/23 2005 100 %     Weight 09/17/23 2006 145 lb 12.8 oz (66.1 kg)     Height --      Head Circumference --      Peak Flow --      Pain Score 09/17/23 2006 0     Pain Loc --      Pain Education --      Exclude from Growth Chart --     Most recent vital signs: Vitals:   09/17/23 2005  BP: 110/66  Pulse: 88  Resp: 11  Temp: 99.4 F (37.4 C)  SpO2: 100%   General: Awake, alert, oriented. CV:  Good peripheral perfusion. Regular rate and rhythm. Resp:  Normal effort. Lungs clear. Abd:  No distention.    ED Results / Procedures / Treatments   Labs (all labs ordered are listed, but only abnormal results are displayed) Labs Reviewed  CBC WITH DIFFERENTIAL/PLATELET - Abnormal; Notable for the following components:      Result Value   RDW 15.7 (*)    All other components within normal limits  COMPREHENSIVE METABOLIC PANEL -  Abnormal; Notable for the following components:   Sodium 125 (*)    Chloride 93 (*)    CO2 21 (*)    Glucose, Bld 811 (*)    BUN 33 (*)    Creatinine, Ser 1.98 (*)    Calcium 8.4 (*)    Albumin 3.4 (*)    Alkaline Phosphatase 140 (*)    GFR, Estimated 38 (*)    All other components within normal limits  URINALYSIS, ROUTINE W REFLEX MICROSCOPIC - Abnormal; Notable for the following components:   Color, Urine STRAW (*)    APPearance CLEAR (*)    Glucose, UA >=500 (*)    Hgb urine dipstick SMALL (*)    Leukocytes,Ua SMALL (*)    Bacteria, UA RARE (*)    All other components within normal limits  CBG MONITORING, ED - Abnormal; Notable for the following components:   Glucose-Capillary >600 (*)    All other components within normal limits  BETA-HYDROXYBUTYRIC ACID  BETA-HYDROXYBUTYRIC ACID  BLOOD GAS, VENOUS     EKG  I, Phineas Semen,  attending physician, personally viewed and interpreted this EKG  EKG Time: 2004 Rate: 88 Rhythm: sinus rhythm Axis: left axis deviation Intervals: qtc 467 QRS: LVH ST changes: no st elevation Impression: abnormal ekg   RADIOLOGY None  PROCEDURES:  Critical Care performed: No  MEDICATIONS ORDERED IN ED: Medications  lactated ringers bolus 1,322 mL (1,332 mLs Intravenous New Bag/Given 09/17/23 2046)  lactated ringers bolus 1,000 mL (1,000 mLs Intravenous New Bag/Given 09/17/23 2034)     IMPRESSION / MDM / ASSESSMENT AND PLAN / ED COURSE  I reviewed the triage vital signs and the nursing notes.                              Differential diagnosis includes, but is not limited to, hyperkalemia, hyperglycemia  Patient's presentation is most consistent with acute presentation with potential threat to life or bodily function.   The patient is on the cardiac monitor to evaluate for evidence of arrhythmia and/or significant heart rate changes.  Patient presented to the emergency department today because of concerns for hyperkalemia.   Blood work here shows normal potassium level.  However patient's blood work is concerning for elevated blood sugar.  No anion gap to suggest diabetic keto acidosis.  At this time will give IV fluids and insulin to try to bring down patient's glucose level.  Patient's blood sugar did improve after IV fluids and insulin.  Patient states he continues to feel well.  Will plan on discharging.  Discussed with patient portance of following up with his primary care to further manage blood sugar.     FINAL CLINICAL IMPRESSION(S) / ED DIAGNOSES   Final diagnoses:  Hyperglycemia     Note:  This document was prepared using Dragon voice recognition software and may include unintentional dictation errors.    Phineas Semen, MD 09/17/23 [phone]

## 2023-09-17 NOTE — Discharge Instructions (Signed)
Please seek medical attention for any high fevers, chest pain, shortness of breath, change in behavior, persistent vomiting, bloody stool or any other new or concerning symptoms.  

## 2023-09-18 LAB — BLOOD GAS, VENOUS
Bicarbonate: 22.2 mmol/L — ABNORMAL HIGH (ref 20.0–28.0)
O2 Saturation: 51.6 mmol/L — ABNORMAL HIGH (ref 0.0–2.0)
Patient temperature: 37
Patient temperature: 51.6 %
pCO2, Ven: 43 mm[Hg] — ABNORMAL LOW (ref 44–60)
pH, Ven: 7.32 (ref 7.25–7.43)
pO2, Ven: <31 mmol/L — CL (ref 32–45)

## 2023-09-21 LAB — URINE CULTURE: Culture: >=100000 — AB

## 2023-10-27 ENCOUNTER — Emergency Department: Payer: Medicaid Other

## 2023-10-27 ENCOUNTER — Emergency Department
Admission: EM | Admit: 2023-10-27 | Discharge: 2023-10-27 | Disposition: A | Discharge status: Home / Self Care | Payer: Medicaid Other | Attending: Emergency Medicine | Admitting: Emergency Medicine

## 2023-10-27 ENCOUNTER — Other Ambulatory Visit: Payer: Self-pay

## 2023-10-27 DIAGNOSIS — E1165 Type 2 diabetes mellitus with hyperglycemia: Secondary | ICD-10-CM | POA: Insufficient documentation

## 2023-10-27 DIAGNOSIS — R1084 Generalized abdominal pain: Secondary | ICD-10-CM | POA: Diagnosis not present

## 2023-10-27 DIAGNOSIS — R109 Unspecified abdominal pain: Secondary | ICD-10-CM | POA: Diagnosis present

## 2023-10-27 LAB — COMPREHENSIVE METABOLIC PANEL WITH GFR
ALT: 37 U/L (ref 0–44)
AST: 31 U/L (ref 15–41)
Albumin: 3.4 g/dL — ABNORMAL LOW (ref 3.5–5.0)
Alkaline Phosphatase: 141 U/L — ABNORMAL HIGH (ref 38–126)
Anion gap: 8 (ref 5–15)
BUN: 34 mg/dL — ABNORMAL HIGH (ref 6–20)
CO2: 16 mmol/L — ABNORMAL LOW (ref 22–32)
Calcium: 8.8 mg/dL — ABNORMAL LOW (ref 8.9–10.3)
Chloride: 109 mmol/L (ref 98–111)
Creatinine, Ser: 1.53 mg/dL — ABNORMAL HIGH (ref 0.61–1.24)
GFR, Estimated: 52 mL/min — ABNORMAL LOW
Glucose, Bld: 237 mg/dL — ABNORMAL HIGH (ref 70–99)
Potassium: 4.6 mmol/L (ref 3.5–5.1)
Sodium: 133 mmol/L — ABNORMAL LOW (ref 135–145)
Total Bilirubin: <0.2 mg/dL
Total Protein: 7.2 g/dL (ref 6.5–8.1)

## 2023-10-27 LAB — URINALYSIS, ROUTINE W REFLEX MICROSCOPIC
Bilirubin Urine: NEGATIVE
Glucose, UA: >=500 mg/dL — AB
Ketones, ur: NEGATIVE mg/dL
Nitrite: NEGATIVE
Protein, ur: NEGATIVE mg/dL
Specific Gravity, Urine: 1.016 (ref 1.005–1.030)
WBC, UA: >50 WBC/hpf (ref 0–5)
pH: 5 (ref 5.0–8.0)

## 2023-10-27 LAB — CBC WITH DIFFERENTIAL/PLATELET
Abs Immature Granulocytes: 0.04 10*3/uL (ref 0.00–0.07)
Basophils Absolute: 0.1 10*3/uL (ref 0.0–0.1)
Basophils Relative: 1 %
Eosinophils Absolute: 0.3 10*3/uL (ref 0.0–0.5)
Eosinophils Relative: 3 %
HCT: 40 % (ref 39.0–52.0)
Hemoglobin: 12.8 g/dL — ABNORMAL LOW (ref 13.0–17.0)
Immature Granulocytes: 0 %
Lymphocytes Relative: 17 %
Lymphs Abs: 1.9 10*3/uL (ref 0.7–4.0)
MCH: 29 pg (ref 26.0–34.0)
MCHC: 32 g/dL (ref 30.0–36.0)
MCV: 90.7 fL (ref 80.0–100.0)
Monocytes Absolute: 0.8 10*3/uL (ref 0.1–1.0)
Monocytes Relative: 7 %
Neutro Abs: 8.2 10*3/uL — ABNORMAL HIGH (ref 1.7–7.7)
Neutrophils Relative %: 72 %
Platelets: 256 10*3/uL (ref 150–400)
RBC: 4.41 MIL/uL (ref 4.22–5.81)
RDW: 18.3 % — ABNORMAL HIGH (ref 11.5–15.5)
Smear Review: NORMAL
WBC: 11.3 10*3/uL — ABNORMAL HIGH (ref 4.0–10.5)
nRBC: 0 % (ref 0.0–0.2)

## 2023-10-27 LAB — LIPASE, BLOOD: Lipase: 19 U/L (ref 11–51)

## 2023-10-27 MED ORDER — LORAZEPAM 2 MG/ML IJ SOLN
0.5000 mg | Freq: Once | INTRAMUSCULAR | Status: AC
  Administered 2023-10-27: 0.5 mg via INTRAVENOUS
  Filled 2023-10-27: qty 1

## 2023-10-27 MED ORDER — IOHEXOL 300 MG/ML  SOLN
100.0000 mL | Freq: Once | INTRAMUSCULAR | Status: AC | PRN
  Administered 2023-10-27: 100 mL via INTRAVENOUS

## 2023-10-27 NOTE — ED Triage Notes (Signed)
EMS brings pt in from home for "pain all over"; bilat BKA, redness/swelling around suprapubic cath; has appt at [facility] on Monday but wants to be examined tonight

## 2023-10-27 NOTE — ED Triage Notes (Signed)
Pt to ED via EMS from home, pt reports feeling like his pancreatitis is flaring up and c/o discomfort to the site of his suprapubic catheter. Pt denies n/v/d denies fevers.

## 2023-10-27 NOTE — ED Provider Notes (Signed)
[facility] Provider Note    Event Date/Time   First MD Initiated Contact with Patient 10/27/23 0254     (approximate)   History   Abdominal Pain   HPI  Timothy Clayton is a 57 year old male with history of chronic pancreatitis, neurogenic bladder with suprapubic catheter in place, diabetes presenting to the emergency department for evaluation of abdominal pain.  About a week ago, patient reports he began to develop discomfort in his abdomen that he thinks is related to his "pancreatitis".  He additionally reports some discomfort around his catheter.  Reports that it is still draining.  He is scheduled to have it replaced on Monday.  No nausea or vomiting.    Physical Exam   Triage Vital Signs: ED Triage Vitals  Encounter Vitals Group     BP 10/27/23 0034 102/76     Systolic BP Percentile --      Diastolic BP Percentile --      Pulse Rate 10/27/23 0034 85     Resp 10/27/23 0034 16     Temp 10/27/23 0034 98.7 F (37.1 C)     Temp src --      SpO2 10/27/23 0029 97 %     Weight 10/27/23 0033 138 lb (62.6 kg)     Height 10/27/23 0033 5\' 8"  (1.727 m)     Head Circumference --      Peak Flow --      Pain Score 10/27/23 0033 7     Pain Loc --      Pain Education --      Exclude from Growth Chart --     Most recent vital signs: Vitals:   10/27/23 0034 10/27/23 0330  BP: 102/76 124/73  Pulse: 85 79  Resp: 16 15  Temp: 98.7 F (37.1 C)   SpO2: 98% 94%     General: Somnolent but arousable, interactive CV:  Regular rate, good peripheral perfusion.  Resp:  Unlabored respirations, lungs clear to auscultation Abd:  Soft, nondistended, reports discomfort at suprapubic catheter insertion site there are no significant erythema, warmth, swelling, drainage noted at this area no overlying dressing, otherwise abdomen with mild generalized tenderness Neuro:  Symmetric facial movement, fluid speech   ED Results / Procedures / Treatments   Labs (all  labs ordered are listed, but only abnormal results are displayed) Labs Reviewed  COMPREHENSIVE METABOLIC PANEL - Abnormal; Notable for the following components:      Result Value   Sodium 133 (*)    CO2 16 (*)    Glucose, Bld 237 (*)    BUN 34 (*)    Creatinine, Ser 1.53 (*)    Calcium 8.8 (*)    Albumin 3.4 (*)    Alkaline Phosphatase 141 (*)    GFR, Estimated 52 (*)    All other components within normal limits  URINALYSIS, ROUTINE W REFLEX MICROSCOPIC - Abnormal; Notable for the following components:   Color, Urine YELLOW (*)    APPearance CLOUDY (*)    Glucose, UA >=500 (*)    Hgb urine dipstick SMALL (*)    Leukocytes,Ua LARGE (*)    Bacteria, UA FEW (*)    All other components within normal limits  CBC WITH DIFFERENTIAL/PLATELET - Abnormal; Notable for the following components:   WBC 11.3 (*)    Hemoglobin 12.8 (*)    RDW 18.3 (*)    Neutro Abs 8.2 (*)    All other components within normal limits  LIPASE,  BLOOD     EKG EKG independently reviewed interpreted by myself (ER attending) demonstrates:    RADIOLOGY Imaging independently reviewed and interpreted by myself demonstrates:  CT with chronic pancreatic changes without obvious acute findings.  Findings suggestive of constipation noted.  PROCEDURES:  Critical Care performed: No  Procedures   MEDICATIONS ORDERED IN ED: Medications  LORazepam (ATIVAN) injection 0.5 mg (0.5 mg Intravenous Given 10/27/23 0338)  iohexol (OMNIPAQUE) 300 MG/ML solution 100 mL (100 mLs Intravenous Contrast Given 10/27/23 0358)     IMPRESSION / MDM / ASSESSMENT AND PLAN / ED COURSE  I reviewed the triage vital signs and the nursing notes.  Differential diagnosis includes, but is not limited to, pancreatitis flare, bowel obstruction, UTI, other acute intra-abdominal process  Patient's presentation is most consistent with acute presentation with potential threat to life or bodily function.  57 year old male presenting to the  emergency department for evaluation of abdominal pain.  Stable vitals on presentation.  Labs with acidosis, but normal anion gap, not suggestive of DKA.  Glucose slightly elevated at 237.  Creatinine not worsened from baseline.  CBC with mild leukocytosis WC of 11.3.  Urine is concerning for infection, but with indwelling suprapubic catheter, likely colonized.  Patient denying fevers, back pain, no evidence of sepsis here.  Discussed with patient, he is comfortable with holding off on empiric treatment.  He does have follow-up with his urology team in a few days.  His lipase here is normal.  CT with grossly abnormal pancreas, but chronic in appearance.  Could still have chronic pancreatitis with normal lipase, but without significant discomfort here, sleeping most of the times I go to evaluate him.  I did discuss possibility of discharge with close follow-up and patient is comfortable with this plan.  Strict return precautions provided.  Patient discharged stable condition.    FINAL CLINICAL IMPRESSION(S) / ED DIAGNOSES   Final diagnoses:  Generalized abdominal pain     Rx / DC Orders   ED Discharge Orders     None        Note:  This document was prepared using Dragon voice recognition software and may include unintentional dictation errors.   [name], MD 10/27/23 [phone]

## 2023-10-27 NOTE — Discharge Instructions (Signed)
You were seen in the emergency room today for evaluation of your abdominal pain.  Your CT showed chronic changes in your pancreas, but did not show any new emergency findings.  Please keep your scheduled follow-up with your outpatient team for further evaluation.  Return to the ER for new or worsening symptoms including fevers, worsening abdominal pain, or any other new or concerning symptoms.

## 2024-08-21 NOTE — Progress Notes (Signed)
 [facility] Internal Medicine  EMBEDDED CARE MANAGEMENT OUTREACH ENCOUNTER         Date of Service:  08/21/2024     Service:  Care Coordination - phone Is there someone else in the room? No.  MyChart use by patient is active: no  Post-outreach Action Items: Provider: pls order Dex 7 receiver to go with dex 6 sensor . CM: await  callback from CM at [facility] about transportation . Patient: updated CM on where he got his wheelchair .   Embedded Care Management (ECM) Outreach Purpose of outreach: Diabetes  Care Manager (CM) completed the following: Reviewed chart 02/2020- [facility] rehab dc to [facility], recommendation for a wheelchair for pt.  Identified Resources:  [facility] Urology [phone] [facility] [phone] [facility] - [email or web address] [facility] Managed Medicaid - [phone] Pt has a long term support services CM named Ms Merilee at phone number [phone]    Updates New concerns or symptoms: Yes: see below . Updates on ongoing concerns/symptoms: Pain- pt voices he has pain from his SP cath and this has increased his anxiety over the past weekend. Pt voices he went to [facility] twice over wknd but they only were able to give him assistance with his anxiety. Pt agreed with CM question if his pain and anxiety are dependent on each other .  Pt states he has called his urologist office but they were not able to get him in any sooner than his appt he already has scheduled for next week on 09/03/24 Pt does not want to go to the ED local to him , only wishes to go to [facility] for care but doesn't want to go to [facility] ED as he feels he will have to wait for long periods (even [facility])  CM suggested to pt to come to [facility] for possible UTI after discussion with CCM RN in separate chat , however pt has declined.  CM called Urology office to inquire about appt but was not successful in obtaining quicker appt- they will put him on a list for any cancellations Aide care - pt  is feeling he wants now to go back to the [facility] program to be able to have transportation on the weekend. (This past wknd was very frustrating to him) He does not have aide care on the wknd as he has PCS with [employer] in the home which has gave him an extra 400$ per month for groceries . ( He is not eligible for food stamps due to criminal records over 30 years ago)  He has reached out to DSS and will be bring paperwork for MD to complete on this.  CM attempted to problem solve on food insecurity for his brittle DM and pt does not have solution for that in the future.  CM left message with Ms Merilee Dux CM about other transportation options for pt .   Wheelchair cushion- Pt is not able to get his wheelchair cushion from [facility] as he did not get his manual wheelchair from [facility] per email CM responded to CM inquired about status of our order submitted on 08/03/24 Reviewed chart, it appears pt obtained his manual wheelchair in March-April 2021 after he discharged from [facility] and was sent to [facility]. Pt would be eligible possibly for new manual wheelchair and this may be best route for him to get a foam cushion. He voices he cannot get his electric wheelchair cushion onto  his manual.  CM had to call pt back and inquire if he remembers where he got his manual wheelchair vs we can possibly just get him a new chair . Await callback . Dexcom- MD has ordered -on 8/13 dex com 7 sensor  In the past he was ordered a dex com 6 receiver in June  Discussed with CCM Ezra-   the dexcom 6 receiver was sent to [facility] [address] [place]. it is not compatible with dexcom g7 sensor  Md would need to order a dex 7 receiver . New barriers: No/ none.  Recent hospitalization / ED / Urgent Care or providers seen outside the [facility] system: Yes: pt states he went to [facility] x2 over wknd for anxiety and pain.SABRA Refills needed: No/ none. Medication response/ notes:  N/A.   Wrap-up Reviewed upcoming clinic appointment(s): Yes. Plans to keep appointment(s): Yes. Transportation assistance needed: No. Patient was encouraged to reach out to their provider with any questions or concerns  Future Appointments  Date Time Provider Department Center  08/29/2024 10:00 AM [place], Almarie SAUNDERS, MD [facility]  09/03/2024 11:30 AM SURURO NURSE [id number]  09/20/2024  8:40 AM Dawna Cordella NOVAK, MD [facility]  10/01/2024  1:30 PM Fayette Ozell Fallow, DO [facility]  10/22/2024  9:00 AM Eino Tinnie Degree, MD [facility]  10/25/2024  9:00 AM Shelley Almarie SAUNDERS, MD [facility]  05/29/2025 10:00 AM [facility]  05/29/2025 11:30 AM Luwanna Caretha Anis, MD [facility]    A copy of this Patient Outreach/ECM Encounter was sent to patient's Primary Care Provider

## 2024-08-21 NOTE — Telephone Encounter (Signed)
 [employer] Eligibility  Patient is eligible for [employer] (according to most recent zip code and insurance information)?  No

## 2024-09-20 NOTE — Progress Notes (Signed)
 Internal Medicine Clinic Visit  Reason for visit: Inattention  A/P:     1. Inattention   2. 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  Patient initially concern for ADD/ADHD, but inattentive episodes do not seem consistent with either of these diagnoses.  Overall, most worrisome thing I would want to rule out is possible partial seizures given his history of CVAs.  He also has a history of diabetes, heart failure for which I want to obtain some basic lab work to rule out other organic causes of this inattentiveness.  Will pursue ambulatory EEG monitoring to help assess for seizures, place neurology referral given long referral time.  If all of this workup is negative, could consider further attention to possible ADHD/ADD moving forward. - Follow-up CBC, CMP (patient to obtain labs on Monday 10/6) - Follow-up ambulatory EEG - Neurology referral  Type 1 diabetes mellitus, insulin -dependent   He has type 1 diabetes with a recent hypoglycemic episode (blood sugar 54 mg/dL). Blood sugar fluctuates between 76 and 220 mg/dL. Insulin  dosage is 4-6 units at night, but insulin  is avoided when blood sugar is low to prevent hypoglycemia. An upcoming endocrinologist appointment is scheduled for October 13. Ensure Dexcom setup for continuous glucose monitoring. Monitor blood sugar levels closely and follow up with the endocrinologist on October 13. - Follow-up A1c (patient to obtain labs on Monday 10/6)  Return in about 4 weeks (around 10/18/2024).  Staffed with Dr. Manuela, discussed  __________________________________________________________  HPI: Past Medical History[1] 58 y.o.male w chronic pancreatitis, T1DM, HTN, neurogenic bladder with indwelling foley catheter  presenting for follow-up.  Patient describes years of problems with maintaining attention.  He describes that often during conversations he will zone out, and find himself zoning back in did not remember what has just been said to him.  This happened with his sister recently, who thought he might need to be prescribed Adderall due to concerns for ADHD/ADD.  No other symptoms or signs of depression or anxiety.  No history of ADHD/ADD diagnosed in childhood.  This change is not associated with any changes in medications that he can recall, but is been going on for so long that it might be hard to tell.  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  Ext: No edema in bilateral lower extremities which are status post AKA   PHQ-9 Score:   GAD-7 Score:    Medication adherence and barriers to the treatment plan have been addressed. Opportunities to optimize healthy behaviors have been discussed. Patient / caregiver voiced understanding.   Results        [  1] Past Medical History: Diagnosis Date  . Abdominal pain 07/18/2022  . Acute encephalopathy 07/18/2022  . Acute systolic congestive heart failure (CMS-HCC) 11/19/2017  . AKI (acute kidney injury) 04/04/2023  . AKI (acute kidney injury)  04/04/2023  . Chronic pancreatitis    (CMS-HCC)   . CVA (cerebral vascular accident)    (CMS-HCC) 05/02/2024  . Elevated serum creatinine 01/31/2023  . Encephalopathy 04/14/2023  . Erectile dysfunction 06/19/2014  . GERD (gastroesophageal reflux disease)   . Hyperkalemia 01/02/2019  . Hypertension, benign 06/19/2009  . Hypokalemia 10/01/2022  . Malnutrition (HHS-HCC) 10/08/2013  . MEDICATION CONTRACT SIGNED 12/06/2013   Patient signed contract stating he would only request/receive opiate medications and other controlled substances from [facility].  . Mixed hyperlipidemia 11/05/2009  . Moderate nonproliferative diabetic retinopathy (CMS-HCC) 02/05/2011  . Necrotizing fasciitis    (CMS-HCC) 02/07/2020  . Neurogenic bladder   . Neurogenic bladder   . Neuropathy   . Open-angle glaucoma 02/05/2011  . Orchitis and epididymitis 02/09/2023  . Peripheral arterial disease 03/01/2014   11/2013 Arterial studies:   Right: Arterial obstruction involving the tibioperoneal vessels. RT great toe pressure = 80 mmHg.   Left: Arterial obstruction involving the tibioperoneal vessels. LT Great toe pressure = 62 mmHg.   SABRA Peripheral neuropathy   . Pre-ulcerative corn or callous 12/30/2016  . Prostate abscess 04/27/2013  . Pyuria 07/18/2022  . Retention of urine 04/27/2012  . Retinal detachment 08/20/2020   Tractional retinal detachment, right eye  . Suprapubic abscess 04/27/2013  . Toxic metabolic encephalopathy 10/01/2022  . Type II diabetes mellitus with ophthalmic manifestations, uncontrolled 06/06/2008  . Upper respiratory infection 09/12/2017  . Urinary tract infection associated with cystostomy catheter 05/27/2023  . Vitamin D  deficiency 04/27/2013

## 2024-10-17 NOTE — Progress Notes (Addendum)
 ------------------------------------------------------------------------------- Attestation signed by Clayton Timothy Blumenthal, MD at 10/18/24 1623   I saw and evaluated the patient, participating in the key portions of the service.  I reviewed the resident's note.  I agree with the resident's findings and plan. Timothy Timothy Jeri, MD      -------------------------------------------------------------------------------    Referring Provider: Marvis Comer Clayton,*   PCP: Timothy Almarie SAUNDERS, MD  10/18/2024  Chief Complaint: CKD4  [name] is a 58 y.o. male with a past medical history of alcohol use disorder (in remission) c/b chronic pancreatitis and now pancreatogenic diabetes, HTN, CAD, s/p bilateral AKA, suprapubic catheter for neurogenic bladder c/b recurrent MDR UTIs, and CKD4 who is was seen in consultation at the request of Dr. Shelley Almarie SAUNDERS, MD for evaluation of CKD4.   He was admitted to [facility] in April 2024 for AKI and eosinophilia after a course of bactrim for UTI. 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   He had a prolonged admission in March 2025 for an initial presentation of AMS and DKA following a recent ERCP. He required intubation for severe multifocal PNA. Course was c/b AKI though did not require KRT.  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 There were glomerular mesangial immune deposits however no evidence that these were active. He has had bilateral AKAs and has severe cachexia, and his Cr likely significantly overestimates his renal function. His eGFR has largely been in the 20s since 2024 when going by cystatin C.  - Medications  should be dosed in the future based on his eGFR by cystatin C        - I have sent in a script for lyrica 50mg  BID to start weaning him down on this (could be contributing to reported periods of inattentiveness and he does not report benefit from it)  - UACR 189 (has improved over last year from 346), UPCR 961 mg/g (has been stable over last year) - Dialysis/Transplant planning: Per [facility], likelihood of ESRD progression 16% at 2 years and 44% at 5 years based on most recent available data. I briefly brought up dialysis on 10/17/2024. Will discuss more at our next visit. Not sure that he would be a good transplant candidate at this time with his malnutrition and poorly controlled DM, but we can talk about it at next visit as well.  - RAAS blockade has been limited by hyperkalemia. Ultimately would like to get him started on ARB. Will stop his amlodipine and switch him to chlorthalidone.        - If blood pressure can tolerate, would next like to start him on telmisartan (will update PCP on plan so she can start at future visit if she feels appropriate)  - I worry about SGLT2i use in him given his suprapubic catheter and hx of recurrent MDR UTIs. Would hold off on this for now.  - Diabetes management as below  Lab Results  Component Value Date   C3 Complement 75 03/14/2024   C4 Complement 6.4 (L) 03/14/2024   dsDNA Ab Negative 03/16/2024   ENA Screen 0.50 03/13/2024   Rheumatoid Factor 12.8 03/16/2024   Cryoglobulin SEE COMMENTS 03/17/2024   Antinuclear Ab <1:80 (Negative) 03/14/2024   Hep B Core Total Ab Nonreactive 03/07/2023  Hepatitis C Ab Nonreactive 03/09/2024   Hepatitis C Ab Negative 10/08/2013   HGB A1C, POC 8.5 (H) 07/07/2023   HGB A1C, POC 7.3 (H) 12/23/2014   Hemoglobin A1C 9.1 (H) 10/17/2024   Pancreatogenic Diabetes: Last A1c 9.1% on 10/17/2024. Goal A1c <8.  - Diabetes has been very difficult to control - brittle diabetes, quite sensitive to insulin . Also c/b his advanced CKD -  Needs follow-up with endocrinology - I will message PCP and endocrinology Lab Results  Component Value Date   A1C 9.1 (H) 10/17/2024   Blood Pressure/Volume:  - Goal BP < 130/80  - STOP amlodipine - Start chlorthalidone  - Ideally will be able to start telmisartan as above (previously RAAS blockade limited by hyperkalemia) BP Readings from Last 3 Encounters:  10/17/24 140/60  09/20/24 119/61  09/07/24 127/65   Anemia screening:  - Hgb at goal - Iron panel and ferritin at next PCP visit, ordered Lab Results  Component Value Date   HGB 12.8 (L) 10/17/2024   HGB 12.3 (L) 07/13/2024   HGB 10.2 (L) 05/18/2024   Iron Saturation (%)  Date Value Ref Range Status  03/20/2024 5 (L) 20 - 55 % Final    Lab Results  Component Value Date   FERRITIN 1,190.4 (H) 03/20/2024      CKD-MBD:  - Will have PTH and Mg checked at PCP visit  Lab Results  Component Value Date   CALCIUM 9.2 10/17/2024   CALCIUM 9.1 07/13/2024   Lab Results  Component Value Date   ALBUMIN 3.3 (L) 10/17/2024   ALBUMIN 3.2 (L) 07/13/2024    Lab Results  Component Value Date   PHOS 3.4 10/17/2024   PHOS 4.4 05/18/2024   Lab Results  Component Value Date   PTH 53.3 04/23/2024   PTH 158.1 (H) 09/22/2023     Mr.Timothy Clayton will follow up in 3 months  Patient was seen and staffed with Dr. Zeitler.  Tinnie Kaufman, MD [facility] Division of Nephrology & Hypertension PGY-4 ----------------------------------------------------  HPI  [name] is well known to me as I was previously his primary care physician. He is seeing me now in Nephrology clinic for his CKD4.   He reports that he has been having difficulties with his blood sugar at home. His blood sugar has been low at times, as low as the 30s. He is only using his insulin  about once per week.   He reports a good appetite, no N/V. He does have confusion at times and was recently seen in IM clinic for periods of inattentiveness. EEG was ordered. He notes  robust urine output - sometimes fills his urine bag up to three times a day.    PHYSICAL EXAM:   Vitals:   10/17/24 1042  BP: 140/60  Pulse: 74  Temp: 35.7 C (96.3 F)    CONSTITUTIONAL: Alert, chronically ill appearing, no distress, cachectic  CARDIOVASCULAR: Regular, normal S1/S2 heart sounds, no murmurs, no rubs.  PULM: Clear to auscultation bilaterally, normal work of breathing on room air GASTROINTESTINAL: suprapubic catheter tubing with white sediment visible EXTREMITIES: s/p bilateral AKA  Urine Sediment: Not examined 10/17/2024  Portions of this note may be dictated using Dragon voice recognition software as well as with the assistance of Abridge AI software, and as such, may contain unintentional spelling or other transcription errors.  Any question regarding the content of this documentation should be directed to the individual who electronically signed.

## 2024-10-19 NOTE — Progress Notes (Signed)
 Labs ordered at appointment earlier in 09/2024 for inattentiveness workup. Largely unremarkable. Things that stand out are hyperglycemia with blood sugar of 321 and A1c of 9.1% from 7.0% in 04/2024. Otherwise, Cr is 1.68 from baseline of 1.3-1.4.   Will ask him to keep log of blood sugars in advance of his appointment with his PCP on 11/6.

## 2024-10-21 ENCOUNTER — Emergency Department
Admission: EM | Admit: 2024-10-21 | Discharge: 2024-10-21 | Disposition: A | Discharge status: Home / Self Care | Attending: Emergency Medicine | Admitting: Emergency Medicine

## 2024-10-21 ENCOUNTER — Emergency Department

## 2024-10-21 ENCOUNTER — Encounter: Payer: Self-pay | Admitting: Emergency Medicine

## 2024-10-21 DIAGNOSIS — I10 Essential (primary) hypertension: Secondary | ICD-10-CM | POA: Insufficient documentation

## 2024-10-21 DIAGNOSIS — R197 Diarrhea, unspecified: Secondary | ICD-10-CM | POA: Diagnosis not present

## 2024-10-21 DIAGNOSIS — E1065 Type 1 diabetes mellitus with hyperglycemia: Secondary | ICD-10-CM | POA: Insufficient documentation

## 2024-10-21 DIAGNOSIS — R1013 Epigastric pain: Secondary | ICD-10-CM | POA: Diagnosis present

## 2024-10-21 DIAGNOSIS — R112 Nausea with vomiting, unspecified: Secondary | ICD-10-CM | POA: Insufficient documentation

## 2024-10-21 LAB — CBC WITH DIFFERENTIAL/PLATELET
Abs Immature Granulocytes: 0.03 K/uL (ref 0.00–0.07)
Basophils Absolute: 0.1 K/uL (ref 0.0–0.1)
Basophils Relative: 2 %
Eosinophils Absolute: 0.2 K/uL (ref 0.0–0.5)
Eosinophils Relative: 3 %
HCT: 41.9 % (ref 39.0–52.0)
Hemoglobin: 13.5 g/dL (ref 13.0–17.0)
Immature Granulocytes: 0 %
Lymphocytes Relative: 22 %
Lymphs Abs: 1.8 K/uL (ref 0.7–4.0)
MCH: 28.5 pg (ref 26.0–34.0)
MCHC: 32.2 g/dL (ref 30.0–36.0)
MCV: 88.4 fL (ref 80.0–100.0)
Monocytes Absolute: 0.4 K/uL (ref 0.1–1.0)
Monocytes Relative: 5 %
Neutro Abs: 5.6 K/uL (ref 1.7–7.7)
Neutrophils Relative %: 68 %
Platelets: 228 K/uL (ref 150–400)
RBC: 4.74 MIL/uL (ref 4.22–5.81)
RDW: 17.4 % — ABNORMAL HIGH (ref 11.5–15.5)
WBC: 8.1 K/uL (ref 4.0–10.5)
nRBC: 0 % (ref 0.0–0.2)

## 2024-10-21 LAB — COMPREHENSIVE METABOLIC PANEL WITH GFR
ALT: 25 U/L (ref 0–44)
AST: 31 U/L (ref 15–41)
Albumin: 3.7 g/dL (ref 3.5–5.0)
Alkaline Phosphatase: 128 U/L — ABNORMAL HIGH (ref 38–126)
Anion gap: 18 — ABNORMAL HIGH (ref 5–15)
BUN: 34 mg/dL — ABNORMAL HIGH (ref 6–20)
CO2: 16 mmol/L — ABNORMAL LOW (ref 22–32)
Calcium: 9.1 mg/dL (ref 8.9–10.3)
Chloride: 107 mmol/L (ref 98–111)
Creatinine, Ser: 1.55 mg/dL — ABNORMAL HIGH (ref 0.61–1.24)
GFR, Estimated: 51 mL/min — ABNORMAL LOW (ref >60)
Glucose, Bld: 157 mg/dL — ABNORMAL HIGH (ref 70–99)
Potassium: 4.1 mmol/L (ref 3.5–5.1)
Sodium: 141 mmol/L (ref 135–145)
Total Bilirubin: 1.3 mg/dL — ABNORMAL HIGH (ref 0.0–1.2)
Total Protein: 8.4 g/dL — ABNORMAL HIGH (ref 6.5–8.1)

## 2024-10-21 LAB — LIPASE, BLOOD: Lipase: 17 U/L (ref 11–51)

## 2024-10-21 MED ORDER — ONDANSETRON HCL 4 MG/2ML IJ SOLN
4.0000 mg | Freq: Once | INTRAMUSCULAR | Status: AC
  Administered 2024-10-21: 4 mg via INTRAVENOUS
  Filled 2024-10-21: qty 2

## 2024-10-21 MED ORDER — IOHEXOL 300 MG/ML  SOLN
100.0000 mL | Freq: Once | INTRAMUSCULAR | Status: AC | PRN
  Administered 2024-10-21: 100 mL via INTRAVENOUS

## 2024-10-21 MED ORDER — HYDROMORPHONE HCL 1 MG/ML IJ SOLN
1.0000 mg | Freq: Once | INTRAMUSCULAR | Status: AC
  Administered 2024-10-21: 1 mg via INTRAVENOUS
  Filled 2024-10-21: qty 1

## 2024-10-21 MED ORDER — SODIUM CHLORIDE 0.9 % IV BOLUS
1000.0000 mL | Freq: Once | INTRAVENOUS | Status: AC
  Administered 2024-10-21: 1000 mL via INTRAVENOUS

## 2024-10-21 NOTE — Discharge Instructions (Signed)
 Return to the ER for new, worsening, or persistent severe abdominal pain, vomiting, fever, weakness, or any other new or worsening symptoms that concern you.  You may also return at any time if you wish to resume your workup that was started in the ER.  Follow-up with your regular doctor.

## 2024-10-21 NOTE — ED Triage Notes (Signed)
 Pt to ED via EMS from home c/o abd pain since yesterday, hx pancreatitis and feels similar.  Pt is double amputee.   EMS vitals: 164/73 BP, 82 HR, 99% RA, 127 CBG; given 4mg  zofran  and 75mcg fentanyl  en route.

## 2024-10-21 NOTE — ED Notes (Signed)
 Pt actively vomiting at this time. Pt still asking to leave.

## 2024-10-21 NOTE — ED Notes (Signed)
 Pt cleaned of stool. New brief placed. Sisiter here to take pt home. Pt placed back into shorts. Pt able to get in wheelchair by self. Pt Dc'd at this time.

## 2024-10-21 NOTE — ED Notes (Signed)
 Pt son calling stating he missed a call this morning. Son informed pt is ready for DC and was attempting to get him to pick up pt. Son informed this RN he is out of town at this time but we could call other numbers in pts chart. This RN unaware at the time, no other numbers listed in pt chart.  Asked pt if there was a friend or neighbor that could pick him up? Pt stating yes. Asked if pt knew the numbers, pt said I don't know. Will speak with diplomatic services operational officer on EMS.

## 2024-10-21 NOTE — ED Notes (Signed)
 Pt stating has not eaten all day and asking for something to eat. Sandwich tray provided to pt and pt starting to vomit. Dr Levander informed of pt vomiting. IV Zofran  to be ordered.

## 2024-10-21 NOTE — ED Notes (Signed)
 Pt asking about when he can go home, pt informed we are waiting for insurance to get back to EMS, then waiting for EMS to arrive to take pt home. Pt stating ok and rolled back over.

## 2024-10-21 NOTE — ED Provider Notes (Signed)
 [facility] Provider Note    Event Date/Time   First MD Initiated Contact with Patient 10/21/24 [phone]     (approximate)   History   Abdominal Pain   HPI  Timothy Clayton is a 58 y.o. male with a history of CVA, type 1 diabetes, chronic pancreatitis, hypertension, and neurogenic bladder with indwelling Foley who presents with epigastric abdominal pain, acute onset yesterday evening, persistent course since then, associated with nausea and vomiting.  The patient has also had some diarrhea.  He has had chills as well.  He states it feels similar to prior episodes of pancreatitis.  He denies any alcohol use and states that he did not eat anything specific that set this off.  I reviewed the past medical records.  The patient's most recent outpatient encounter was on 10/2 with internal medicine at [facility] for follow-up of his chronic conditions.   Physical Exam   Triage Vital Signs: ED Triage Vitals [10/21/24 0235]  Encounter Vitals Group     BP 134/75     Girls Systolic BP Percentile      Girls Diastolic BP Percentile      Boys Systolic BP Percentile      Boys Diastolic BP Percentile      Pulse Rate 78     Resp 18     Temp 98 F (36.7 C)     Temp Source Oral     SpO2 99 %     Weight      Height      Head Circumference      Peak Flow      Pain Score      Pain Loc      Pain Education      Exclude from Growth Chart     Most recent vital signs: Vitals:   10/21/24 0235  BP: 134/75  Pulse: 78  Resp: 18  Temp: 98 F (36.7 C)  SpO2: 99%     General: Alert, no distress.  CV:  Good peripheral perfusion.  Resp:  Normal effort.  Abd:  Soft with moderate epigastric tenderness.  No distention.  Other:  No jaundice or scleral icterus.  Somewhat dry mucous membranes.   ED Results / Procedures / Treatments   Labs (all labs ordered are listed, but only abnormal results are displayed) Labs Reviewed  COMPREHENSIVE METABOLIC PANEL WITH GFR - Abnormal;  Notable for the following components:      Result Value   CO2 16 (*)    Glucose, Bld 157 (*)    BUN 34 (*)    Creatinine, Ser 1.55 (*)    Total Protein 8.4 (*)    Alkaline Phosphatase 128 (*)    Total Bilirubin 1.3 (*)    GFR, Estimated 51 (*)    Anion gap 18 (*)    All other components within normal limits  CBC WITH DIFFERENTIAL/PLATELET - Abnormal; Notable for the following components:   RDW 17.4 (*)    All other components within normal limits  LIPASE, BLOOD  URINALYSIS, ROUTINE W REFLEX MICROSCOPIC     EKG     RADIOLOGY    PROCEDURES:  Critical Care performed: No  Procedures   MEDICATIONS ORDERED IN ED: Medications  sodium chloride  0.9 % bolus 1,000 mL (0 mLs Intravenous Stopped 10/21/24 0543)  HYDROmorphone  (DILAUDID ) injection 1 mg (1 mg Intravenous Given 10/21/24 0419)  ondansetron  (ZOFRAN ) injection 4 mg (4 mg Intravenous Given 10/21/24 0421)  iohexol  (OMNIPAQUE ) 300 MG/ML solution 100  mL (100 mLs Intravenous Contrast Given 10/21/24 0502)     IMPRESSION / MDM / ASSESSMENT AND PLAN / ED COURSE  I reviewed the triage vital signs and the nursing notes.  58 year old male with PMH as noted above presents with epigastric pain, nausea and vomiting.  Differential diagnosis includes, but is not limited to, acute pancreatitis, gastroenteritis, gastritis, gastroparesis, less likely SBO, volvulus.  We will obtain lab workup, CT, give fluids, analgesia, antiemetics, and reassess.  Patient's presentation is most consistent with acute presentation with potential threat to life or bodily function.  The patient is on the cardiac monitor to evaluate for evidence of arrhythmia and/or significant heart rate changes.  ----------------------------------------- 5:50 AM on 10/21/2024 -----------------------------------------  CMP shows elevated creatinine which is baseline for the patient.  Alkaline phosphatase is also baseline.  Bilirubin is minimally elevated.  Anion gap is  elevated consistent with dehydration and vomiting.  Glucose is 157.  There is no evidence of DKA.  Lipase is normal.  CBC shows no leukocytosis or other acute findings.  The patient declined CT stating that he could not lie flat, even though it appears that his pain is well-controlled.  When I went to go talk to him he did lie flat on his back several times as he was turning around in the bed.  I attempted to address his concerns about the CT but he stated that he absolutely did not want to have it.  I advised the patient that without doing imaging, I cannot fully evaluate his abdominal pain and cannot rule out serious or even life-threatening causes such as a complication of his pancreatitis, acute infection, or bowel obstruction.  The patient demonstrates understanding of this and full decision-making capacity.  He states that he is feeling better and just wants to be discharged home.  He does not want any further workup in the ER.  Although the anion gap is elevated, he has received a liter of saline.  His pain is well-controlled at this time.  He appears relatively comfortable.  I advised him that he may return at any time if he changes his mind and wishes to resume the workup.  I gave strict return precautions, and he expressed understanding.   FINAL CLINICAL IMPRESSION(S) / ED DIAGNOSES   Final diagnoses:  Epigastric abdominal pain     Rx / DC Orders   ED Discharge Orders     None        Note:  This document was prepared using Dragon voice recognition software and may include unintentional dictation errors.    Jacolyn Pae, MD 10/21/24 [phone]

## 2024-10-21 NOTE — ED Triage Notes (Signed)
 Pt reports all day stomach hurting.

## 2024-10-21 NOTE — ED Notes (Signed)
 Pt cleaned of stool by EDT and this RN. Pt placed in new brief. Linen changed, chuck pad placed.

## 2024-10-21 NOTE — ED Notes (Signed)
 Pt on the phone with his son, Zadin Lange, staff spoke with the son stating pt has been here all day attempting to get the pt back home and the only contact we had for the pt was Cardin Nitschke, pt's other son. Redell states is going to attempt to call around to see if anyone could try and come pick him up.

## 2024-10-27 NOTE — Discharge Summary (Addendum)
 Physician Discharge Summary HBR 3 BT1 HBR [address] [place] [address] Dept: [phone] Loc: [phone]   Identifying Information:  Timothy Clayton 1966/02/03 [id number]  Primary Care Physician: Timothy Almarie SAUNDERS, MD  Code Status: Full Code  Admit Date: 10/22/2024  Discharge Date: 10/27/2024   Discharge To: Home with Home Health and/or PT/OT  Discharge Service: HBR - HBR: Hospitalist [facility]   Discharge Attending Physician: No att. providers found  Discharge Diagnoses: Principal Problem:   High anion gap metabolic acidosis (POA: Unknown) Active Problems:   Mixed hyperlipidemia (POA: Yes)   Tobacco use disorder (POA: Yes)   Essential hypertension (POA: Yes)   Neurogenic bladder (POA: Yes)   Generalized abdominal pain (POA: Yes)   Chronic pancreatitis    (CMS-HCC) (POA: Yes)   Stage 4 chronic kidney disease    (CMS-HCC) (POA: Unknown)   Nausea and vomiting (POA: Unknown) Resolved Problems:   * No resolved hospital problems. *   Outpatient Provider Follow Up Issues:  Monitor blood pressure Monitor diabetes control on increased dose of Lantus  to 7 units, encourage intake   Hospital Course:  Timothy Clayton is a 58 yo man with a past medical history of Type 1 diabetes, stage 4 CKD, Alcohol use disorder, chronic pancreatitis, tobacco use disorder, hypertension, history of CVA, and CAD presenting to [facility] with complaint of abdominal pain with nausea and vomiting. He was also found to have diabetic ketoacidosis with high anion gap metabolic acidosis.   Type 1 diabetes  Diabetic ketoacidosis  High anion gap metabolic acidosis  Hypokalemia  hypophosphatemia  hypernatremia On 11/3, Timothy Clayton presented to the emergency department after episodes of nausea and vomiting. He was found to have a blood glucose of 231, which later increased to 688. His pH was found to be 7.2 with an anion gap of 31. He was admitted to the [facility] ICU and placed  on Endotool.  His anion gap improved on this but he remained on IV insulin  for prolonged period of time due to encephalopathy and inability to eat afterwards.  Endo was consulted inpatient.  Was ultimately transition to Lantus  7 units daily and 1 unit of NovoLog  as needed for glucose greater than 250.  He is planned to follow-up with endocrinology in December.  Encouraged to continue having medications for hypoglycemia nearby and to try to have small frequent intake since will often only eat 1 time a day to help prevent hypoglycemia.  Right arm swelling and blistering: Nurses report that IV infiltrated, PVL without signs of acute DVT and no signs of necrosis.  Wound care saw during admission and recommended cleansing area with Vashe and Dermagran gauze.   Abdominal pain  Nausea and Vomiting When he presented to the emergency department he was experiencing 7/10 abdominal pain and vomiting. This vomiting likely contributed to his DKA. His AST/ALT were elevated with a 2:1 ratio, suggestive of an alcoholic cause, however he has been sober from alcohol for a long time according to family. His ethanol level on 11/3 was <10.  His abdominal pain resolved prior to discharge.  He does have some longstanding abdominal pain and previously was prescribed opioids but no longer has an outpatient opioid prescriber.  Urinary tract infection, catheter associated  UA in the ED showed large amount of leukocyte esterase, 50mg /dL of protein, >1000mg /dL of glucose, positive urine ketones at 80mg /dL, blood, increased WBCs, and bacteria. Urine culture grew mixed gram negative and gram positive organisms.  He was placed on piperacillin /tazobactam based on prior susceptibilities  and completed a 5-day course.  Multidrug resistant screen was negative.  Stage 4 CKD  elevated creatinine  Creatinine rose to 1.78 on admission,  Per his outpatient nephrologist, cystatin C is a more accurate measure of his GFR. Cystatin C on 11/4 was  1.77 with a GFR of 36.  His creatinine improved throughout admission and it was around 1-1.2 prior to discharge.  Altered Mental status  agitation Patient was agitated on admission. According to family, he is often agitated and experiences delirium in the setting of infection. He also is being evaluated for recurrent episodes of unresponsiveness. He has a history of alcohol use disorder as described below, however, his ethanol on admission was <10 and blood screening was also negative. Urine toxicology was positive for oxycodone  and cannabinoids. He is also a heavy smoker and was subsequently given a nicotine  patch. A behavioral response was called on 11/3 for severe agitation and he was placed in restraints..  Had several days of encephalopathy during admission.  CT of the head negative for acute process.  Toxic metabolic encephalopathy was attributed to poor cognitive reserve, DKA, possible malnutrition, infection.  Was given IV thiamine.  Mental status improved back to baseline prior to discharge.  Alcohol use disorder  Chronic pancreatitis Patient has a history of alcohol use disorder, however, his ethanol level was <10 and his family reports he has not been drinking recently. His AST/ALT were elevated in the emergency department, however her his lipase was WNL. PETh was  negative..   Tobacco use disorder Per the patient's son, he is a heavy smoker. Nicotine  patches provided.   HTN Consistently elevated to 200s/80-100. Holding chlorthalidone and carvedilol in setting of elevated creatinine and dehydration.  These medications were resumed prior to discharge with resolution of hypertension.  There is discussion about trialing ARB in the future per outpatient nephrologist note.  History of CVA  CAD Continued hospital equivalent to home statin.  He was he was seen by PT and OT during admission.  PT did not recommend services after discharge, OT recommended home health.  Referral sent for home  health with for OT and did nursing.  Order placed for new cushion for wheelchair. Procedures: No admission procedures for hospital encounter. ______________________________________________________________________ Discharge Medications:   Your Medication List     STOP taking these medications    pregabalin 50 MG capsule Commonly known as: LYRICA       START taking these medications    insulin  aspart 100 unit/mL (3 mL) injection pen Commonly known as: NovoLOG  FLEXPEN Inject 0.01 mL (1 Units total) under the skin Three (3) times a day before meals. Take 1 unit if blood glucose is greater than 250. Do not take if below 250.   VASHE 0.033 % Irsl Generic drug: sodium chlor-hypochlorous acid Apply topically daily.       CHANGE how you take these medications    insulin  glargine 100 unit/mL (3 mL) injection pen Commonly known as: BASAGLAR , LANTUS  Inject 0.07 mL (7 Units total) under the skin daily. What changed:  how much to take when to take this   pen needle, diabetic 32 gauge x 5/32 (4 mm) Ndle Commonly known as: COMFORT EZ PEN NEEDLES USE AS DIRECTED THREE TIMES DAILY WITH MEALS What changed: Another medication with the same name was added. Make sure you understand how and when to take each.   pen needle, diabetic 29 gauge x 1/2 (12 mm) Ndle For Insulin  dependent diabetes What changed: You were already taking  a medication with the same name, and this prescription was added. Make sure you understand how and when to take each.       CONTINUE taking these medications    acetaminophen  500 MG tablet Commonly known as: TYLENOL  EXTRA STRENGTH Take 2 tablets (1,000 mg total) by mouth every eight (8) hours as needed for pain.   aquaphor topical ointment Oint Apply 1 Application topically in the morning.   aspirin 81 MG chewable tablet CHEW ONE TABLET DAILY   atorvastatin 40 MG tablet Commonly known as: LIPITOR Take 1 tablet (40 mg total) by mouth daily.    blood-glucose meter kit Disp. blood glucose meter kit preferred by patient's insurance. Dx: Diabetes, E11.9   busPIRone 5 MG tablet Commonly known as: BUSPAR Take 1 tablet (5 mg total) by mouth at bedtime.   carvedilol 25 MG tablet Commonly known as: COREG Take 1 tablet (25 mg total) by mouth two (2) times a day.   cetirizine 10 MG tablet Commonly known as: ZYRTEC Take 1 tablet (10 mg total) by mouth as needed.   chlorthalidone 12.5 mg Tab Take 25 mg by mouth every morning.   cholecalciferol (vitamin D3 25 mcg (1,000 units)) 1,000 unit (25 mcg) tablet Commonly known as: cholecalciferol-25 mcg (1,000 unit) Take 1 tablet (25 mcg total) by mouth daily.   DEXCOM G7 RECEIVER Misc Generic drug: blood-glucose,receiver,cont 1 Units by Miscellaneous route every fourteen (14) days. For Medicare: send to [facility] All other insurance-can go to their regular pharmacy   DEXCOM G7 TRENTON French Generic drug: blood-glucose sensor Inject 1 each under the skin every ten (10) days.   glucose blood test strip Generic drug: blood sugar diagnostic Disp test strips preferred by insurance plan. Testing QID, Dx:E11.65,Z79.4 (Type 2 DM- uncontrolled, insulin  dep)   KETONE URINE TEST Strp Generic drug: acetone (urine) test Use to test in the morning.   lancets Misc Disp. lancets #200 or amount allowed, Testing QID, Dx: E11.65, Z79.4. (DM type 2 , Insulin  Dep)   melatonin 3 mg Tab TAKE THREE TABLETS BY MOUTH NIGHTLY AS NEEDED   MIRALAX 17 gram/dose powder Generic drug: polyethylene glycol Take 17 g by mouth as needed.   multivitamin per tablet Commonly known as: TAB-A-VITE/THERAGRAN Take 1 tablet by mouth daily.   naloxone 4 mg/actuation nasal spray Commonly known as: NARCAN One spray in either nostril once for known/suspected opioid overdose. May repeat every 2-3 minutes in alternating nostril til EMS arrives   nicotine  14 mg/24 hr patch Commonly known as: NICODERM CQ  Place  1 patch on the skin daily.   OLANZapine 5 MG tablet Commonly known as: ZYPREXA TAKE ONE TABLET BY MOUTH NIGHTLY   pancrelipase  (Lip-Prot-Amyl) 24,000-76,000 -120,000 unit Cpdr delayed release capsule Commonly known as: CREON  Take 4 capsules by mouth Three (3) times a day with a meal.   pantoprazole  40 MG tablet Commonly known as: Protonix  Take 1 tablet (40 mg total) by mouth daily.   sertraline 25 MG tablet Commonly known as: ZOLOFT Take 1 tablet (25 mg total) by mouth daily.   SINUS CONGESTION & PAIN ORAL Take 2 tablets by mouth as needed. Indications: congestion   sodium bicarbonate 650 mg tablet Take 1 tablet (650 mg total) by mouth Three (3) times a day.   sodium chloride  0.65 % nasal spray Commonly known as: OCEAN 1 spray into each nostril every six (6) hours as needed for congestion.   solifenacin 5 MG tablet Commonly known as: VESICARE Take 1 tablet (5 mg  total) by mouth daily.   traZODone 50 MG tablet Commonly known as: DESYREL TAKE ONE TABLET BY MOUTH AT BEDTIME AS NEEDED FOR SLEEP   urinary bag Kit Commonly known as: URINARY LEG BAG Urinary leg bag with extension tubing        Allergies: Aspirin, Gabapentin, Glipizide, Melatonin, Metformin, Nsaids (non-steroidal anti-inflammatory drug), and Arb-angiotensin receptor antagonist ______________________________________________________________________ Pending Test Results (if blank, then none):   Most Recent Labs: All lab results last 24 hours -  Recent Results (from the past 24 hours)  POCT Glucose   Collection Time: 10/26/24  8:50 PM  Result Value Ref Range   Glucose, POC 79 70 - 179 mg/dL  POCT Glucose   Collection Time: 10/27/24  3:01 AM  Result Value Ref Range   Glucose, POC 116 70 - 179 mg/dL  Basic Metabolic Panel   Collection Time: 10/27/24  8:01 AM  Result Value Ref Range   Sodium 143 135 - 145 mmol/L   Potassium 3.6 3.4 - 4.8 mmol/L   Chloride 104 98 - 107 mmol/L   CO2 20.7 20.0 - 31.0  mmol/L   Anion Gap 18 (H) 5 - 14 mmol/L   BUN 9 9 - 23 mg/dL   Creatinine 8.76 (H) 9.26 - 1.18 mg/dL   BUN/Creatinine Ratio 7    eGFR CKD-EPI (2021) Male 67 >=60 mL/min/1.83m2   Glucose 113 70 - 179 mg/dL   Calcium 9.1 8.7 - 89.5 mg/dL  POCT Glucose   Collection Time: 10/27/24  8:01 AM  Result Value Ref Range   Glucose, POC 118 70 - 179 mg/dL  POCT Glucose   Collection Time: 10/27/24 10:54 AM  Result Value Ref Range   Glucose, POC 114 70 - 179 mg/dL  POCT Glucose   Collection Time: 10/27/24  1:25 PM  Result Value Ref Range   Glucose, POC 99 70 - 179 mg/dL    Relevant Studies/Radiology (if blank, then none): PVL Venous Duplex Upper Extremity Right Result Date: 10/26/2024  Peripheral Vascular Lab     [address]   [place], [place] [address]  PVL VENOUS DUPLEX UPPER EXTREMITY RIGHT Patient Demographics Pt. Name: RASHIDI LOH Location: [facility] Inpatient MRN:      [id number]        Sex:      M DOB:      [birth year]      Age:      58 years  Study Information Authorizing         [phone] ANN MARIE      Performed Time       10/26/2024 Provider Name       KUMFER                                     7:44:02 AM Ordering Physician  ANN EARNIE GU      Patient Location     [facility] Accession Number    [id number] [facility]        Technologist         Karna Spivack                                                                RDCS Diagnosis:  Assisting                                           Technologist Ordered Reason For Exam: edema and swelling of R arm Other Indication: Swelling Risk Factors: Immobility, Hx of PE 02/2024 and (RUE wound 03/14/24 referral).  Final Interpretation Right No evidence of DVT detected in the central veins or arm veins. Left Subclavian vein Doppler signal is within normal limits. This indirect finding suggests central vein patency.  Electronically signed by [id number] Almeda Dolores MD on 10/26/2024 at 8:53:54 AM.  Examination Protocol The internal jugular,  brachiocephalic, subclavian, and axillary, brachial, basilic and cephalic veins are routinely assessed on the requested limb. Spectral and Color Doppler data is the primary method for evaluating the brachiocephalic and subclavian veins. Venous compression is used to evaluate the internal jugular, axillary, and upper arm veins. If a unilateral exam is requested, a contralateral subclavian vein Doppler signal is required for comparison.  Limitations:  Duplex Findings Right No abnormality of venous architecture is observed in the central veins. All Doppler signals are appropriately pulsatile with ventilatory excursions. Color Doppler notes appropriate filling in all vessels. The arm veins appear fully compressible. Left Subclavian vein Doppler signal appears within normal limits.  Summary of Findings Right No evidence of obstruction was seen in the central veins or arm veins. Left Normal subclavian vein Doppler signal suggests proximal central venous patency.   Final   CT Head Wo Contrast Result Date: 10/26/2024 EXAM: Computed tomography, head or brain without contrast material. ACCESSION: [id number] UN CLINICAL INDICATION: 58 years old Male with altered mental status, prolonged, history of strokes  COMPARISON: MRA head 03/08/2024 and prior studies TECHNIQUE: Axial CT images of the head from skull base to vertex without contrast. FINDINGS: There is no midline shift. No mass lesion. There is no evidence of acute infarct. Chronic infarct in the right cerebellar hemisphere. The sinuses are pneumatized. Calcifications along the falx. Calcifications of the bilateral carotid siphons. No intracranial hemorrhage or skull fractures.   No acute intracranial abnormality.   ECG 12 Lead Result Date: 10/23/2024 NORMAL SINUS RHYTHM BIATRIAL ENLARGEMENT PATTERN RSR' OR QR PATTERN IN V1 SUGGESTS RIGHT VENTRICULAR CONDUCTION DELAY LEFT ANTERIOR FASCICULAR BLOCK LEFT VENTRICULAR HYPERTROPHY ( Sokolow-Lyon , Cornell product )  PROLONGED QT ABNORMAL ECG WHEN COMPARED WITH ECG OF 22-Oct-2024 10:21, NO SIGNIFICANT CHANGE WAS FOUND Confirmed by Sheng, Siyuan [phone]) on 10/23/2024 3:29:35 PM  ECG 12 Lead Result Date: 10/23/2024 NORMAL SINUS RHYTHM BIATRIAL ENLARGEMENT PATTERN LEFT ANTERIOR FASCICULAR BLOCK PROLONGED QT ABNORMAL ECG WHEN COMPARED WITH ECG OF 13-Jul-2024 11:44, LEFT ANTERIOR FASCICULAR BLOCK IS NOW PRESENT QT HAS LENGTHENED Confirmed by Sheng, Siyuan [phone]) on 10/23/2024 3:02:13 PM  ECG 12 Lead Result Date: 10/23/2024 NORMAL SINUS RHYTHM POSSIBLE LEFT ATRIAL ENLARGEMENT LEFT AXIS DEVIATION INCOMPLETE RIGHT BUNDLE BRANCH BLOCK NONSPECIFIC ST AND T WAVE ABNORMALITY PROLONGED QT ABNORMAL ECG WHEN COMPARED WITH ECG OF 22-Oct-2024 13:07, INVERTED T WAVES HAVE REPLACED NONSPECIFIC T WAVE ABNORMALITY IN LATERAL LEADS  XR Chest Portable Result Date: 10/22/2024 EXAM: XR CHEST PORTABLE ACCESSION: [id number] UN REPORT DATE: 10/22/2024 11:45 AM CLINICAL INDICATION: SHORTNESS OF BREATH  TECHNIQUE: Single View AP Chest Radiograph. COMPARISON: XR CHEST 2 VIEWS 07/13/2024 FINDINGS: Bilateral perihilar opacities and vascular indistinctness is present. Mild peribronchial cuffing. No pleural effusion or pneumothorax. Normal heart size and mediastinal contours.   Findings may represent mild degree of pulmonary edema.  ______________________________________________________________________ Discharge Instructions:      Other Instructions     Discharge instructions     You are hospitalized for diabetic ketoacidosis.  We made the following changes. 1.  Change your Lantus  from 6 units to 7 units.  Please take this in the morning.  You did receive a dose of your Lantus  this morning.  2.  Check your glucose at least 3 times a day. Give additional glucose as below:  Please check 3 times a day: BG 231-280: 1 unit BG 281-300: 2 units BG > 300: 3 units  For the swelling of your arm after the blisters after the IV got dislodged here are  the suggested dressing:  1. Cleanse wound with vashe, pat dry.  2. Apply non-alcohol skin barrier wipe (949661) to periwound and let dry 15 seconds.  3. Apply Dermagran gauze ([id number]) cut to fit wound bed. 4. Cover with appropriate cover dressing.  5. Secure dressing appropriately.  6. Change every other day/PRN if dislodged, soiled or saturated.  You have a follow-up appointment in the internal medicine clinic on 1114 to check in on everything.  Thanks for letting me be part of your care team.  I am wishing you the best.  Please return if you develop fevers chills severe confusion severe abdominal pain nausea or vomiting or other symptoms.  You should always take a long-acting insulin  due to your risk of developing diabetic ketoacidosis if you do not.  If you are unable to eat you can take half a dose.       Follow Up instructions and Outpatient Referrals    Ambulatory Referral to Home Health     Reason for referral: weakenss, diabetes education, ransfers   Is this a Personnel Officer or [facility] Patient?: No   Physician to follow patient's care: PCP   Disciplines requested:  Nursing Occupational Therapy     Nursing requested:  Wound Care Teaching/skilled observation and assessment     Wound count: Wound 1   Wound 1 type/staging: see comments   Wound 1 location: see comments   Wound 1 care orders: see comments   Wound 1 order frequency: see comments   What teaching is needed (new diagnosis? new medications?): Diabetic  education and monitoring   Requested [facility] Date:  Comment - within48 hours of dc   Discharge instructions       Appointments which have been scheduled for you    Nov 02, 2024 9:20 AM (Arrive by 9:05 AM) OFFICE VISIT with [facility] FOLLOW-UP [facility] SAME DAY CLINIC [place] [facility] REGION) [address] [facility] 1 through 4 [place] [address] [phone]     Nov 05, 2024 9:30 AM (Arrive by 9:00 AM) NURSE VISIT URO with  [facility] NURSE [facility] UROLOGY [address] [place] [facility] REGION) [address] [place] [address] [phone]     Nov 12, 2024 9:45 AM (Arrive by 9:30 AM) OT ORTHO EVAL with Olam CHRISTELLA Muslim, OT [facility] OT HAND CENTER [place] [facility] REGION) [address] Room [address] [place] [address] [phone]     Dec 03, 2024 10:45 AM (Arrive by 10:30 AM) RETURN DIABETES with Ozell Elsie Candy, DO [facility] DIABETES AND ENDOCRINOLOGY [place] [facility] REGION) [address] [facility] 1 through 4 [place] [address] [phone]     Jan 11, 2025 10:30 AM (Arrive by 10:15 AM) RETURN NEPHROLOGY with Tinnie  Diane Goldbeck, MD [facility] KIDNEY SPECIALTY AND TRANSPLANT CLINIC [place] [facility] REGION) [address] [facility] 1 through 4 [place] [address] [phone]     Feb 25, 2025 8:30 AM (Arrive by 8:05 AM) NEW NEUROLOGY with Sula Sheree Morrie Terra, MD [facility] NEUROLOGY CLINIC [address] [place] [address] [facility] REGION) [address] [place] [address] [phone]     May 29, 2025 10:00 AM (Arrive by 9:30 AM) VASCULAR ULTRASOUND CAROTID DUPLEX BILATERAL with [facility] PVL OUTPATIENT 2 IMG PVL [facility]) [address] [place] [address] [phone]     May 29, 2025 11:30 AM (Arrive by 11:00 AM) RETURN VASCULAR with Caretha Altamease Chant, MD [facility] VASCULAR SURGERY [place] [facility] REGION) [address] [place] [address] [phone]        ______________________________________________________________________ Discharge Day Services: BP 137/77   Pulse 90   Temp 36.8 C (98.2 F) (Oral)   Resp 18   Ht 172.7 cm (5' 7.99)   Wt 52.9 kg (116 lb 10 oz)   SpO2 100%   BMI 17.74 kg/m  Pt seen on the day of discharge and determined appropriate for  discharge.  Condition at Discharge: stable  Length of Discharge: I spent greater than 30 mins in the discharge of this patient.

## 2024-11-05 ENCOUNTER — Inpatient Hospital Stay
Admission: EM | Admit: 2024-11-05 | Discharge: 2024-11-10 | DRG: 698 | Disposition: A | Discharge status: Home / Self Care | Attending: Internal Medicine | Admitting: Internal Medicine

## 2024-11-05 ENCOUNTER — Encounter: Payer: Self-pay | Admitting: Emergency Medicine

## 2024-11-05 DIAGNOSIS — G40909 Epilepsy, unspecified, not intractable, without status epilepticus: Secondary | ICD-10-CM | POA: Diagnosis present

## 2024-11-05 DIAGNOSIS — N390 Urinary tract infection, site not specified: Principal | ICD-10-CM | POA: Diagnosis present

## 2024-11-05 DIAGNOSIS — K861 Other chronic pancreatitis: Secondary | ICD-10-CM | POA: Diagnosis present

## 2024-11-05 DIAGNOSIS — Z89611 Acquired absence of right leg above knee: Secondary | ICD-10-CM

## 2024-11-05 DIAGNOSIS — T83510A Infection and inflammatory reaction due to cystostomy catheter, initial encounter: Principal | ICD-10-CM | POA: Diagnosis present

## 2024-11-05 DIAGNOSIS — Z888 Allergy status to other drugs, medicaments and biological substances status: Secondary | ICD-10-CM

## 2024-11-05 DIAGNOSIS — E1151 Type 2 diabetes mellitus with diabetic peripheral angiopathy without gangrene: Secondary | ICD-10-CM | POA: Diagnosis present

## 2024-11-05 DIAGNOSIS — E1122 Type 2 diabetes mellitus with diabetic chronic kidney disease: Secondary | ICD-10-CM | POA: Diagnosis present

## 2024-11-05 DIAGNOSIS — E871 Hypo-osmolality and hyponatremia: Secondary | ICD-10-CM | POA: Diagnosis present

## 2024-11-05 DIAGNOSIS — Z79899 Other long term (current) drug therapy: Secondary | ICD-10-CM

## 2024-11-05 DIAGNOSIS — N319 Neuromuscular dysfunction of bladder, unspecified: Secondary | ICD-10-CM | POA: Diagnosis present

## 2024-11-05 DIAGNOSIS — K219 Gastro-esophageal reflux disease without esophagitis: Secondary | ICD-10-CM | POA: Diagnosis present

## 2024-11-05 DIAGNOSIS — Z681 Body mass index (BMI) 19 or less, adult: Secondary | ICD-10-CM

## 2024-11-05 DIAGNOSIS — E1165 Type 2 diabetes mellitus with hyperglycemia: Secondary | ICD-10-CM | POA: Diagnosis present

## 2024-11-05 DIAGNOSIS — Z7951 Long term (current) use of inhaled steroids: Secondary | ICD-10-CM

## 2024-11-05 DIAGNOSIS — Y846 Urinary catheterization as the cause of abnormal reaction of the patient, or of later complication, without mention of misadventure at the time of the procedure: Secondary | ICD-10-CM | POA: Diagnosis present

## 2024-11-05 DIAGNOSIS — E11649 Type 2 diabetes mellitus with hypoglycemia without coma: Secondary | ICD-10-CM | POA: Diagnosis not present

## 2024-11-05 DIAGNOSIS — N184 Chronic kidney disease, stage 4 (severe): Secondary | ICD-10-CM | POA: Diagnosis present

## 2024-11-05 DIAGNOSIS — I739 Peripheral vascular disease, unspecified: Secondary | ICD-10-CM | POA: Insufficient documentation

## 2024-11-05 DIAGNOSIS — I129 Hypertensive chronic kidney disease with stage 1 through stage 4 chronic kidney disease, or unspecified chronic kidney disease: Secondary | ICD-10-CM | POA: Diagnosis present

## 2024-11-05 DIAGNOSIS — Z794 Long term (current) use of insulin: Secondary | ICD-10-CM

## 2024-11-05 DIAGNOSIS — G9341 Metabolic encephalopathy: Secondary | ICD-10-CM | POA: Diagnosis present

## 2024-11-05 DIAGNOSIS — R627 Adult failure to thrive: Secondary | ICD-10-CM | POA: Diagnosis present

## 2024-11-05 DIAGNOSIS — Z91148 Patient's other noncompliance with medication regimen for other reason: Secondary | ICD-10-CM

## 2024-11-05 DIAGNOSIS — E43 Unspecified severe protein-calorie malnutrition: Secondary | ICD-10-CM | POA: Diagnosis present

## 2024-11-05 DIAGNOSIS — Z9359 Other cystostomy status: Secondary | ICD-10-CM

## 2024-11-05 DIAGNOSIS — E878 Other disorders of electrolyte and fluid balance, not elsewhere classified: Secondary | ICD-10-CM | POA: Diagnosis present

## 2024-11-05 DIAGNOSIS — I10 Essential (primary) hypertension: Secondary | ICD-10-CM

## 2024-11-05 DIAGNOSIS — I2510 Atherosclerotic heart disease of native coronary artery without angina pectoris: Secondary | ICD-10-CM | POA: Diagnosis present

## 2024-11-05 DIAGNOSIS — Z1152 Encounter for screening for COVID-19: Secondary | ICD-10-CM

## 2024-11-05 DIAGNOSIS — E860 Dehydration: Secondary | ICD-10-CM | POA: Diagnosis present

## 2024-11-05 DIAGNOSIS — Z89612 Acquired absence of left leg above knee: Secondary | ICD-10-CM

## 2024-11-05 DIAGNOSIS — F1120 Opioid dependence, uncomplicated: Secondary | ICD-10-CM | POA: Diagnosis present

## 2024-11-05 DIAGNOSIS — F419 Anxiety disorder, unspecified: Secondary | ICD-10-CM | POA: Diagnosis present

## 2024-11-05 DIAGNOSIS — F17200 Nicotine dependence, unspecified, uncomplicated: Secondary | ICD-10-CM | POA: Diagnosis present

## 2024-11-05 DIAGNOSIS — E785 Hyperlipidemia, unspecified: Secondary | ICD-10-CM | POA: Diagnosis present

## 2024-11-05 DIAGNOSIS — E119 Type 2 diabetes mellitus without complications: Secondary | ICD-10-CM

## 2024-11-05 DIAGNOSIS — E109 Type 1 diabetes mellitus without complications: Secondary | ICD-10-CM | POA: Diagnosis present

## 2024-11-05 DIAGNOSIS — Z886 Allergy status to analgesic agent status: Secondary | ICD-10-CM

## 2024-11-05 DIAGNOSIS — N179 Acute kidney failure, unspecified: Secondary | ICD-10-CM | POA: Diagnosis present

## 2024-11-05 DIAGNOSIS — N1831 Chronic kidney disease, stage 3a: Secondary | ICD-10-CM | POA: Insufficient documentation

## 2024-11-05 DIAGNOSIS — R4182 Altered mental status, unspecified: Secondary | ICD-10-CM

## 2024-11-05 LAB — CBG MONITORING, ED: Glucose-Capillary: 172 mg/dL — ABNORMAL HIGH (ref 70–99)

## 2024-11-05 MED ORDER — HYDROXYZINE HCL 10 MG PO TABS
10.0000 mg | ORAL_TABLET | Freq: Once | ORAL | Status: AC
  Administered 2024-11-06: 10 mg via ORAL
  Filled 2024-11-05: qty 1

## 2024-11-05 MED ORDER — SODIUM CHLORIDE 0.9 % IV BOLUS
500.0000 mL | Freq: Once | INTRAVENOUS | Status: AC
  Administered 2024-11-06: 500 mL via INTRAVENOUS

## 2024-11-05 NOTE — ED Provider Notes (Signed)
 [facility] Provider Note    Event Date/Time   First MD Initiated Contact with Patient 11/05/24 2300     (approximate)   History   Failure To Thrive  Pt BIB [facility] from home for FTT. Per report, pt has not taken medications or eaten in 4 days but has consumed mariajuana and opioids. Patient son's who are his Healthcare Power of [place], wants assisted living for patient. Patient anxious and rocking back and forth.  Past Medical History:  Diagnosis Date   Diabetes mellitus without complication (HCC)    Pancreatitis    Suprapubic catheter (HCC)       HPI Timothy Clayton is a 58 y.o. male PMH T1DM, CKD, neurogenic bladder with chronic suprapubic catheter, pancreatitis, s/p BLE amputation p/w AMS - Patient is a limited historian.  Tells me he is here due to anxiety.  Collateral gathered from patient's sons who are at bedside and are HPOA.  They note patient lives alone.  Apparently has not eaten or taken his medications for about 4 days.  Does largely receive his care at [facility].  They are wondering if he may require placement at this point.  Note he has been somewhat more confused than usual, he is recalling events that have not occurred. - They state he did test positive for oxycodone  and marijuana at his previous admission.  Sounds as patient has not been drinking at all recently. - No recent infectious symptoms - Foley catheter changed about 1 week ago - Were told by patient's primary care doctor and nephrologist to come to emergency department for evaluation  Per chart review, patient was recently seen at an outside emergency department on 10/29/2024 for nausea.  Previously been admitted and discharged on 10/27/2024 in setting of DKA.  Workup at that visit unremarkable, patient requested discharge home.  Discharge summary from 10/27/2024 also reviewed.     Physical Exam   Triage Vital Signs: ED Triage Vitals [11/05/24 2232]  Encounter Vitals Group     BP  (!) 149/93     Girls Systolic BP Percentile      Girls Diastolic BP Percentile      Boys Systolic BP Percentile      Boys Diastolic BP Percentile      Pulse Rate (!) 57     Resp 17     Temp 97.6 F (36.4 C)     Temp Source Oral     SpO2 95 %     Weight      Height      Head Circumference      Peak Flow      Pain Score      Pain Loc      Pain Education      Exclude from Growth Chart     Most recent vital signs: Vitals:   11/05/24 2232  BP: (!) 149/93  Pulse: (!) 57  Resp: 17  Temp: 97.6 F (36.4 C)  SpO2: 95%     General: Awake, somewhat anxious CV:  Good peripheral perfusion.  Mild bradycardia, regular rhythm, RP 2+ Resp:  Mild tachypnea, CTAB Abd:  No distention. Nontender to deep palpation throughout.  Suprapubic Foley draining small amount of clear yellow urine. Other:  Right upper extremity bandage, bandaging removed, well-healed superficial skin ulcerations, no surrounding erythema, warmth, fluctuance, purulence   ED Results / Procedures / Treatments   Labs (all labs ordered are listed, but only abnormal results are displayed) Labs Reviewed  CBC WITH  DIFFERENTIAL/PLATELET - Abnormal; Notable for the following components:      Result Value   RBC 4.13 (*)    Hemoglobin 11.9 (*)    HCT 36.5 (*)    RDW 17.2 (*)    All other components within normal limits  BLOOD GAS, VENOUS - Abnormal; Notable for the following components:   pH, Ven 7.47 (*)    pCO2, Ven 37 (*)    Acid-Base Excess 3.2 (*)    All other components within normal limits  URINALYSIS, COMPLETE (UACMP) WITH MICROSCOPIC - Abnormal; Notable for the following components:   Color, Urine AMBER (*)    APPearance CLOUDY (*)    Glucose, UA 50 (*)    Ketones, ur 5 (*)    Protein, ur 100 (*)    Nitrite POSITIVE (*)    Leukocytes,Ua LARGE (*)    Bacteria, UA MANY (*)    All other components within normal limits  COMPREHENSIVE METABOLIC PANEL WITH GFR - Abnormal; Notable for the following components:    Sodium 133 (*)    Chloride 96 (*)    Glucose, Bld 208 (*)    BUN 22 (*)    Creatinine, Ser 1.70 (*)    Alkaline Phosphatase 127 (*)    GFR, Estimated 46 (*)    All other components within normal limits  LIPASE, BLOOD - Abnormal; Notable for the following components:   Lipase <10 (*)    All other components within normal limits  URINE DRUG SCREEN - Abnormal; Notable for the following components:   Tetrahydrocannabinol POSITIVE (*)    All other components within normal limits  CBG MONITORING, ED - Abnormal; Notable for the following components:   Glucose-Capillary 172 (*)    All other components within normal limits  RESP PANEL BY RT-PCR (RSV, FLU A&B, COVID)  RVPGX2  ETHANOL  AMMONIA     EKG  Ecg = nsr, rate 100, no ste/std, no significant repol abnl, +RBBB, +LAD, normal intervals. QTC   RADIOLOGY Radiology interpreted myself and radiology report reviewed.  No acute pathology identified.    PROCEDURES:  Critical Care performed: No  Procedures   MEDICATIONS ORDERED IN ED: Medications  piperacillin -tazobactam (ZOSYN ) IVPB 3.375 g (0 g Intravenous Stopped 11/06/24 0253)  hydrOXYzine (ATARAX) tablet 10 mg (10 mg Oral Given 11/06/24 0031)  sodium chloride  0.9 % bolus 500 mL (0 mLs Intravenous Stopped 11/06/24 0115)  LORazepam  (ATIVAN ) injection 0.5 mg (0.5 mg Intravenous Given 11/06/24 0131)  LORazepam  (ATIVAN ) tablet 1 mg (1 mg Oral Given 11/06/24 0235)     IMPRESSION / MDM / ASSESSMENT AND PLAN / ED COURSE  I reviewed the triage vital signs and the nursing notes.                              DDX/MDM/AP: Differential diagnosis includes, but is not limited to, metabolic or infectious encephalopathy, consider delirium, consider alcohol intoxication or other concomitant substance use, no clear withdrawal syndrome at this time.  Consider intracranial pathology including hemorrhage.  Consider primary anxiety contributing.    Plan: - Labs - EKG - CT head - Chest  x-ray - Infectious workup including urinalysis, chest x-ray, viral swab  Patient's presentation is most consistent with acute presentation with potential threat to life or bodily function.  The patient is on the cardiac monitor to evaluate for evidence of arrhythmia and/or significant heart rate changes.  ED course below. Workup w/ e/o UTI, otherwise unremarkable.  Based on prior urine cultures, appears Zosyn  is most appropriate antibiotic regimen, started here.  Suprapubic catheter changed about 1 week ago per family report, can consider replacement inpatient as needed.  Suspect some degree of infectious encephalopathy causing altered mental status.  Patient has also been requiring some sedation for reported anxiety and intermittently disruptive behavior, not aggressive.  May require placement.  Family amenable to admission at our facility.  Admitted to hospitalist service.  Clinical Course as of 11/06/24 0252  Tue Nov 06, 2024  0027 CMP with very mild hyponatremia and hypochloremia with mild bump in creatinine, receiving IV fluid  Glucose mildly elevated, no evidence of DKA [MM]  0027 Lipase normal  Viral swab negative  Ammonia normal [MM]  0129 Patient failed repeat attempt at CT despite hydroxyzine, was reportedly very claustrophobic  Will give small dose IV Ativan  [MM]  0204 Urinalysis concerning for infection, will treat  Based on prior urine cultures, does have multiple urine cultures growing E. coli, Pseudomonas, Proteus Mirabella's.  Often resistant to multiple antibiotics.  Was treated with Zosyn  for his last UTI which based on urine sensitivities appears appropriate  Will order Zosyn  [MM]  0211 Family updated.  Amenable to admission at our hospital.  Will follow CT head and chest x-ray and plan for admission here [MM]  0226 Tetrahydrocannabinol(!): POSITIVE [MM]  0226 CTH: IMPRESSION: 1. No acute intracranial abnormality. 2. 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   [MM]  0226 CXR: IMPRESSION: 1. Mild elevation of the left hemidiaphragm.   [MM]  0227 Hospitalist consult order placed [MM]    Clinical Course User Index [MM] Clarine Ozell LABOR, MD     FINAL CLINICAL IMPRESSION(S) / ED DIAGNOSES   Final diagnoses:  Urinary tract infection associated with cystostomy catheter, initial encounter  Altered mental status, unspecified altered mental status type  Failure to thrive in adult     Rx / DC Orders   ED Discharge Orders     None        Note:  This document was prepared using Dragon voice recognition software and may include unintentional dictation errors.   Clarine Ozell LABOR, MD 11/06/24 [id number]

## 2024-11-05 NOTE — ED Notes (Signed)
 This tech emptied pt's bag out upon arrival, bag contained  900 ml of urine.

## 2024-11-05 NOTE — ED Triage Notes (Signed)
 Pt BIB [facility] from home for FTT. Per report, pt has not taken medications or eaten in 4 days but has consumed mariajuana and opioids. Patient son's who are his Healthcare Power of [place], wants assisted living for patient. Patient anxious and rocking back and forth.  Past Medical History:  Diagnosis Date   Diabetes mellitus without complication (HCC)    Pancreatitis    Suprapubic catheter (HCC)

## 2024-11-05 NOTE — ED Notes (Signed)
 Pt with foley bag that was inserted prior to arrival.

## 2024-11-06 ENCOUNTER — Other Ambulatory Visit: Payer: Self-pay

## 2024-11-06 ENCOUNTER — Emergency Department

## 2024-11-06 DIAGNOSIS — Z89611 Acquired absence of right leg above knee: Secondary | ICD-10-CM | POA: Diagnosis not present

## 2024-11-06 DIAGNOSIS — R627 Adult failure to thrive: Secondary | ICD-10-CM | POA: Diagnosis present

## 2024-11-06 DIAGNOSIS — N179 Acute kidney failure, unspecified: Secondary | ICD-10-CM | POA: Diagnosis present

## 2024-11-06 DIAGNOSIS — E785 Hyperlipidemia, unspecified: Secondary | ICD-10-CM | POA: Diagnosis present

## 2024-11-06 DIAGNOSIS — G9341 Metabolic encephalopathy: Secondary | ICD-10-CM | POA: Diagnosis present

## 2024-11-06 DIAGNOSIS — I10 Essential (primary) hypertension: Secondary | ICD-10-CM | POA: Diagnosis not present

## 2024-11-06 DIAGNOSIS — G40909 Epilepsy, unspecified, not intractable, without status epilepticus: Secondary | ICD-10-CM

## 2024-11-06 DIAGNOSIS — E871 Hypo-osmolality and hyponatremia: Secondary | ICD-10-CM | POA: Diagnosis present

## 2024-11-06 DIAGNOSIS — E860 Dehydration: Secondary | ICD-10-CM | POA: Diagnosis present

## 2024-11-06 DIAGNOSIS — I129 Hypertensive chronic kidney disease with stage 1 through stage 4 chronic kidney disease, or unspecified chronic kidney disease: Secondary | ICD-10-CM | POA: Diagnosis present

## 2024-11-06 DIAGNOSIS — K861 Other chronic pancreatitis: Secondary | ICD-10-CM | POA: Diagnosis present

## 2024-11-06 DIAGNOSIS — N390 Urinary tract infection, site not specified: Secondary | ICD-10-CM | POA: Diagnosis present

## 2024-11-06 DIAGNOSIS — Y846 Urinary catheterization as the cause of abnormal reaction of the patient, or of later complication, without mention of misadventure at the time of the procedure: Secondary | ICD-10-CM | POA: Diagnosis present

## 2024-11-06 DIAGNOSIS — N1831 Chronic kidney disease, stage 3a: Secondary | ICD-10-CM | POA: Insufficient documentation

## 2024-11-06 DIAGNOSIS — E11649 Type 2 diabetes mellitus with hypoglycemia without coma: Secondary | ICD-10-CM | POA: Diagnosis not present

## 2024-11-06 DIAGNOSIS — N189 Chronic kidney disease, unspecified: Secondary | ICD-10-CM

## 2024-11-06 DIAGNOSIS — I739 Peripheral vascular disease, unspecified: Secondary | ICD-10-CM | POA: Insufficient documentation

## 2024-11-06 DIAGNOSIS — K219 Gastro-esophageal reflux disease without esophagitis: Secondary | ICD-10-CM

## 2024-11-06 DIAGNOSIS — E1151 Type 2 diabetes mellitus with diabetic peripheral angiopathy without gangrene: Secondary | ICD-10-CM | POA: Diagnosis present

## 2024-11-06 DIAGNOSIS — Z89612 Acquired absence of left leg above knee: Secondary | ICD-10-CM | POA: Diagnosis not present

## 2024-11-06 DIAGNOSIS — N184 Chronic kidney disease, stage 4 (severe): Secondary | ICD-10-CM | POA: Diagnosis present

## 2024-11-06 DIAGNOSIS — Z681 Body mass index (BMI) 19 or less, adult: Secondary | ICD-10-CM | POA: Diagnosis not present

## 2024-11-06 DIAGNOSIS — E43 Unspecified severe protein-calorie malnutrition: Secondary | ICD-10-CM | POA: Insufficient documentation

## 2024-11-06 DIAGNOSIS — Z9359 Other cystostomy status: Secondary | ICD-10-CM | POA: Diagnosis not present

## 2024-11-06 DIAGNOSIS — F1120 Opioid dependence, uncomplicated: Secondary | ICD-10-CM | POA: Diagnosis present

## 2024-11-06 DIAGNOSIS — T83510A Infection and inflammatory reaction due to cystostomy catheter, initial encounter: Secondary | ICD-10-CM | POA: Diagnosis present

## 2024-11-06 DIAGNOSIS — Z794 Long term (current) use of insulin: Secondary | ICD-10-CM | POA: Diagnosis not present

## 2024-11-06 DIAGNOSIS — Z1152 Encounter for screening for COVID-19: Secondary | ICD-10-CM | POA: Diagnosis not present

## 2024-11-06 DIAGNOSIS — E1165 Type 2 diabetes mellitus with hyperglycemia: Secondary | ICD-10-CM | POA: Diagnosis present

## 2024-11-06 DIAGNOSIS — E1122 Type 2 diabetes mellitus with diabetic chronic kidney disease: Secondary | ICD-10-CM | POA: Diagnosis present

## 2024-11-06 LAB — CBC WITH DIFFERENTIAL/PLATELET
Abs Immature Granulocytes: 0.01 K/uL (ref 0.00–0.07)
Basophils Absolute: 0.1 K/uL (ref 0.0–0.1)
Basophils Relative: 1 %
Eosinophils Absolute: 0.1 K/uL (ref 0.0–0.5)
Eosinophils Relative: 2 %
HCT: 36.5 % — ABNORMAL LOW (ref 39.0–52.0)
Hemoglobin: 11.9 g/dL — ABNORMAL LOW (ref 13.0–17.0)
Immature Granulocytes: 0 %
Lymphocytes Relative: 40 %
Lymphs Abs: 2.2 K/uL (ref 0.7–4.0)
MCH: 28.8 pg (ref 26.0–34.0)
MCHC: 32.6 g/dL (ref 30.0–36.0)
MCV: 88.4 fL (ref 80.0–100.0)
Monocytes Absolute: 0.3 K/uL (ref 0.1–1.0)
Monocytes Relative: 5 %
Neutro Abs: 2.8 K/uL (ref 1.7–7.7)
Neutrophils Relative %: 52 %
Platelets: 354 K/uL (ref 150–400)
RBC: 4.13 MIL/uL — ABNORMAL LOW (ref 4.22–5.81)
RDW: 17.2 % — ABNORMAL HIGH (ref 11.5–15.5)
WBC: 5.4 K/uL (ref 4.0–10.5)
nRBC: 0 % (ref 0.0–0.2)

## 2024-11-06 LAB — COMPREHENSIVE METABOLIC PANEL WITH GFR
ALT: 23 U/L (ref 0–44)
AST: 26 U/L (ref 15–41)
Albumin: 4 g/dL (ref 3.5–5.0)
Alkaline Phosphatase: 127 U/L — ABNORMAL HIGH (ref 38–126)
Anion gap: 15 (ref 5–15)
BUN: 22 mg/dL — ABNORMAL HIGH (ref 6–20)
CO2: 23 mmol/L (ref 22–32)
Calcium: 10 mg/dL (ref 8.9–10.3)
Chloride: 96 mmol/L — ABNORMAL LOW (ref 98–111)
Creatinine, Ser: 1.7 mg/dL — ABNORMAL HIGH (ref 0.61–1.24)
GFR, Estimated: 46 mL/min — ABNORMAL LOW (ref >60)
Glucose, Bld: 208 mg/dL — ABNORMAL HIGH (ref 70–99)
Potassium: 4.4 mmol/L (ref 3.5–5.1)
Sodium: 133 mmol/L — ABNORMAL LOW (ref 135–145)
Total Bilirubin: 0.4 mg/dL (ref 0.0–1.2)
Total Protein: 7.5 g/dL (ref 6.5–8.1)

## 2024-11-06 LAB — ETHANOL: Alcohol, Ethyl (B): <15 mg/dL (ref <15)

## 2024-11-06 LAB — RESP PANEL BY RT-PCR (RSV, FLU A&B, COVID)  RVPGX2
Influenza A by PCR: NEGATIVE
Influenza B by PCR: NEGATIVE
Resp Syncytial Virus by PCR: NEGATIVE
SARS Coronavirus 2 by RT PCR: NEGATIVE

## 2024-11-06 LAB — URINALYSIS, COMPLETE (UACMP) WITH MICROSCOPIC
Bilirubin Urine: NEGATIVE
Glucose, UA: 50 mg/dL — AB
Hgb urine dipstick: NEGATIVE
Ketones, ur: 5 mg/dL — AB
Nitrite: POSITIVE — AB
Protein, ur: 100 mg/dL — AB
Specific Gravity, Urine: 1.017 (ref 1.005–1.030)
WBC, UA: >50 WBC/hpf (ref 0–5)
pH: 7 (ref 5.0–8.0)

## 2024-11-06 LAB — URINE DRUG SCREEN
Amphetamines: NEGATIVE
Barbiturates: NEGATIVE
Benzodiazepines: NEGATIVE
Cocaine: NEGATIVE
Fentanyl: NEGATIVE
Methadone Scn, Ur: NEGATIVE
Opiates: NEGATIVE
Tetrahydrocannabinol: POSITIVE — AB

## 2024-11-06 LAB — LIPASE, BLOOD: Lipase: <10 U/L — ABNORMAL LOW (ref 11–51)

## 2024-11-06 LAB — AMMONIA: Ammonia: 18 umol/L (ref 9–35)

## 2024-11-06 MED ORDER — CILOSTAZOL 100 MG PO TABS
100.0000 mg | ORAL_TABLET | Freq: Two times a day (BID) | ORAL | Status: DC
  Administered 2024-11-06 – 2024-11-10 (×8): 100 mg via ORAL
  Filled 2024-11-06 (×11): qty 1

## 2024-11-06 MED ORDER — LORAZEPAM 2 MG/ML IJ SOLN
0.5000 mg | Freq: Four times a day (QID) | INTRAMUSCULAR | Status: DC | PRN
Start: 2024-11-06 — End: 2024-11-10
  Administered 2024-11-06 – 2024-11-07 (×2): 0.5 mg via INTRAVENOUS
  Filled 2024-11-06 (×2): qty 1

## 2024-11-06 MED ORDER — SODIUM CHLORIDE 0.9 % IV SOLN: INTRAVENOUS | Status: DC

## 2024-11-06 MED ORDER — SODIUM CHLORIDE 0.9 % IV SOLN
2.0000 g | INTRAVENOUS | Status: DC
  Filled 2024-11-06: qty 20

## 2024-11-06 MED ORDER — TRAZODONE HCL 50 MG PO TABS
25.0000 mg | ORAL_TABLET | Freq: Every evening | ORAL | Status: DC | PRN
  Administered 2024-11-07: 25 mg via ORAL
  Filled 2024-11-06: qty 1

## 2024-11-06 MED ORDER — LORAZEPAM 1 MG PO TABS
1.0000 mg | ORAL_TABLET | Freq: Once | ORAL | Status: AC
  Administered 2024-11-06: 1 mg via ORAL
  Filled 2024-11-06: qty 1

## 2024-11-06 MED ORDER — ONDANSETRON HCL 4 MG/2ML IJ SOLN
4.0000 mg | Freq: Four times a day (QID) | INTRAMUSCULAR | Status: DC | PRN
  Administered 2024-11-08: 4 mg via INTRAVENOUS
  Filled 2024-11-06: qty 2

## 2024-11-06 MED ORDER — LORAZEPAM 2 MG/ML IJ SOLN
0.5000 mg | Freq: Once | INTRAMUSCULAR | Status: AC
  Administered 2024-11-06: 0.5 mg via INTRAVENOUS
  Filled 2024-11-06: qty 1

## 2024-11-06 MED ORDER — MIDAZOLAM HCL (PF) 2 MG/2ML IJ SOLN
2.0000 mg | Freq: Once | INTRAMUSCULAR | Status: AC
  Administered 2024-11-06: 2 mg via INTRAVENOUS
  Filled 2024-11-06: qty 2

## 2024-11-06 MED ORDER — SODIUM CHLORIDE 0.9 % IV SOLN: INTRAVENOUS | Status: AC

## 2024-11-06 MED ORDER — NICOTINE 21 MG/24HR TD PT24
21.0000 mg | MEDICATED_PATCH | Freq: Every day | TRANSDERMAL | Status: DC
  Administered 2024-11-06 – 2024-11-09 (×3): 21 mg via TRANSDERMAL
  Filled 2024-11-06 (×5): qty 1

## 2024-11-06 MED ORDER — VITAMIN D (ERGOCALCIFEROL) 1.25 MG (50000 UNIT) PO CAPS
50000.0000 [IU] | ORAL_CAPSULE | ORAL | Status: DC
  Administered 2024-11-08: 50000 [IU] via ORAL
  Filled 2024-11-06: qty 1

## 2024-11-06 MED ORDER — PANTOPRAZOLE SODIUM 40 MG PO TBEC
40.0000 mg | DELAYED_RELEASE_TABLET | Freq: Two times a day (BID) | ORAL | Status: DC
  Administered 2024-11-06 – 2024-11-10 (×8): 40 mg via ORAL
  Filled 2024-11-06 (×8): qty 1

## 2024-11-06 MED ORDER — PIPERACILLIN-TAZOBACTAM 3.375 G IVPB 30 MIN
3.3750 g | Freq: Once | INTRAVENOUS | Status: AC
  Administered 2024-11-06: 3.375 g via INTRAVENOUS
  Filled 2024-11-06 (×2): qty 50

## 2024-11-06 MED ORDER — ENSURE PLUS HIGH PROTEIN PO LIQD
237.0000 mL | Freq: Two times a day (BID) | ORAL | Status: DC
  Administered 2024-11-06 – 2024-11-09 (×4): 237 mL via ORAL

## 2024-11-06 MED ORDER — ENOXAPARIN SODIUM 30 MG/0.3ML IJ SOSY
30.0000 mg | PREFILLED_SYRINGE | INTRAMUSCULAR | Status: DC
  Administered 2024-11-07 – 2024-11-10 (×4): 30 mg via SUBCUTANEOUS
  Filled 2024-11-06 (×4): qty 0.3

## 2024-11-06 MED ORDER — HALOPERIDOL LACTATE 5 MG/ML IJ SOLN
1.0000 mg | Freq: Four times a day (QID) | INTRAMUSCULAR | Status: DC | PRN
  Administered 2024-11-06 (×2): 1 mg via INTRAMUSCULAR
  Filled 2024-11-06 (×2): qty 1

## 2024-11-06 MED ORDER — MIRTAZAPINE 15 MG PO TABS
15.0000 mg | ORAL_TABLET | Freq: Every day | ORAL | Status: DC
  Administered 2024-11-06 – 2024-11-09 (×4): 15 mg via ORAL
  Filled 2024-11-06 (×4): qty 1

## 2024-11-06 MED ORDER — HALOPERIDOL LACTATE 5 MG/ML IJ SOLN
2.0000 mg | Freq: Once | INTRAMUSCULAR | Status: AC
  Administered 2024-11-06: 2 mg via INTRAVENOUS
  Filled 2024-11-06: qty 1

## 2024-11-06 MED ORDER — QUETIAPINE FUMARATE 25 MG PO TABS
25.0000 mg | ORAL_TABLET | Freq: Every day | ORAL | Status: DC
  Administered 2024-11-06 – 2024-11-07 (×2): 25 mg via ORAL
  Filled 2024-11-06 (×2): qty 1

## 2024-11-06 MED ORDER — ACETAMINOPHEN 650 MG RE SUPP
650.0000 mg | Freq: Four times a day (QID) | RECTAL | Status: DC | PRN
Start: 2024-11-06 — End: 2024-11-10

## 2024-11-06 MED ORDER — ENOXAPARIN SODIUM 40 MG/0.4ML IJ SOSY
40.0000 mg | PREFILLED_SYRINGE | INTRAMUSCULAR | Status: DC
  Administered 2024-11-06: 40 mg via SUBCUTANEOUS
  Filled 2024-11-06: qty 0.4

## 2024-11-06 MED ORDER — OXYCODONE-ACETAMINOPHEN 5-325 MG PO TABS
1.0000 | ORAL_TABLET | ORAL | Status: DC | PRN
Start: 2024-11-06 — End: 2024-11-06
  Administered 2024-11-06: 1 via ORAL
  Filled 2024-11-06: qty 1

## 2024-11-06 MED ORDER — SODIUM CHLORIDE 0.9 % IV SOLN
1.0000 g | Freq: Two times a day (BID) | INTRAVENOUS | Status: DC
  Administered 2024-11-06 – 2024-11-10 (×9): 1 g via INTRAVENOUS
  Filled 2024-11-06 (×9): qty 20

## 2024-11-06 MED ORDER — OXCARBAZEPINE 150 MG PO TABS
150.0000 mg | ORAL_TABLET | Freq: Every day | ORAL | Status: DC
  Administered 2024-11-06 – 2024-11-09 (×4): 150 mg via ORAL
  Filled 2024-11-06 (×4): qty 1

## 2024-11-06 MED ORDER — PANTOPRAZOLE SODIUM 40 MG PO TBEC
40.0000 mg | DELAYED_RELEASE_TABLET | Freq: Every day | ORAL | Status: DC
  Administered 2024-11-06: 40 mg via ORAL
  Filled 2024-11-06: qty 1

## 2024-11-06 MED ORDER — HALOPERIDOL LACTATE 5 MG/ML IJ SOLN: 1.0000 mg | Freq: Once | INTRAMUSCULAR | Status: DC

## 2024-11-06 MED ORDER — ACETAMINOPHEN 325 MG PO TABS
650.0000 mg | ORAL_TABLET | Freq: Four times a day (QID) | ORAL | Status: DC | PRN
  Administered 2024-11-08: 650 mg via ORAL
  Filled 2024-11-06: qty 2

## 2024-11-06 MED ORDER — MAGNESIUM HYDROXIDE 400 MG/5ML PO SUSP: 30.0000 mL | Freq: Every day | ORAL | Status: DC | PRN

## 2024-11-06 MED ORDER — PANCRELIPASE (LIP-PROT-AMYL) 12000-38000 UNITS PO CPEP
12000.0000 [IU] | ORAL_CAPSULE | Freq: Three times a day (TID) | ORAL | Status: DC
  Administered 2024-11-06 – 2024-11-10 (×12): 12000 [IU] via ORAL
  Filled 2024-11-06 (×13): qty 1

## 2024-11-06 MED ORDER — INSULIN GLARGINE-YFGN 100 UNIT/ML ~~LOC~~ SOLN
12.0000 [IU] | Freq: Every day | SUBCUTANEOUS | Status: DC
  Administered 2024-11-06: 12 [IU] via SUBCUTANEOUS
  Filled 2024-11-06 (×2): qty 0.12

## 2024-11-06 MED ORDER — CHLORHEXIDINE GLUCONATE CLOTH 2 % EX PADS
6.0000 | MEDICATED_PAD | Freq: Every day | CUTANEOUS | Status: DC
  Administered 2024-11-06 – 2024-11-10 (×5): 6 via TOPICAL

## 2024-11-06 MED ORDER — ONDANSETRON HCL 4 MG PO TABS: 4.0000 mg | ORAL_TABLET | Freq: Four times a day (QID) | ORAL | Status: DC | PRN

## 2024-11-06 NOTE — Assessment & Plan Note (Signed)
-   Will continue antihypertensive therapy.

## 2024-11-06 NOTE — H&P (Addendum)
 [place]   PATIENT NAME: Timothy Clayton    MR#:  [id number]  DATE OF BIRTH:  December 02, 1966  DATE OF ADMISSION:  11/05/2024  PRIMARY CARE PHYSICIAN: Pcp, No   Patient is coming from: Home  REQUESTING/REFERRING PHYSICIAN: Clarine Sharper, MD  CHIEF COMPLAINT:   Chief Complaint  Patient presents with   Failure To Thrive    HISTORY OF PRESENT ILLNESS:  Timothy Clayton is a 58 y.o. African-American male with medical history significant for type 2 diabetes mellitus, pancreatitis, CKD, and bilateral AKA with suprapubic catheter, who presented to the emergency room with acute onset of failure to thrive.  The patient has not been taking his medications over the last 4 days and has been smoking marijuana and using opioids.  He was fairly anxious and restless during my interview.  He has not been eating or drinking much.  Is not answering questions during my interview.  No other history could be obtained apart from what his son and H POA could give above.  ED Course: When the patient came to the ER, BP was 149/93 with heart rate of 57 otherwise normal vital signs. Labs reveal mild hyponatremia hypochloremia and blood glucose of 208 with a BUN of 22 and creatinine 1.7 above previous levels of 1.55 and alk phos 126 with otherwise unremarkable CMP.  CBC showed hemoglobin 11.9 hematocrit 36.5 below previous levels of 30.5 and 41.9 on 10/21/2024.  Respiratory panel came back negative.  VBG showed pH 7.47 and HCO3 of 26.9.  UA was positive for UTI. EKG as reviewed by me : EKG showed normal sinus rhythm with a rate of 100 with biatrial enlargement and incomplete right bundle branch block and minimal voltage criteria for LVH with prolonged QT interval with QTc of 466 ms. Imaging: Noncontrast head CT scan revealed no acute intracranial normalities.  It showed remote peripheral cortical infarct within the right cerebral hemisphere.  The patient was given 500 mL IV normal saline bolus and 3.375 g IV  Zosyn , 2 mg of IV Versed and 1.5 mg of IV Ativan  as well as 10 mg of p.o. Atarax and 2 mg of IV Haldol.  He was given 2 g of IV Rocephin .  He will be admitted to a telemetry bed for further evaluation and management. PAST MEDICAL HISTORY:   Past Medical History:  Diagnosis Date   Diabetes mellitus without complication (HCC)    Pancreatitis    Suprapubic catheter (HCC)     PAST SURGICAL HISTORY:  History reviewed. No pertinent surgical history.  SOCIAL HISTORY:   Social History   Tobacco Use   Smoking status: Every Day   Smokeless tobacco: Never  Substance Use Topics   Alcohol use: No    FAMILY HISTORY:  History reviewed. No pertinent family history.  DRUG ALLERGIES:   Allergies  Allergen Reactions   Aspirin     Gross hematuria through suprapubic catheter   Gabapentin Diarrhea   Glipizide Diarrhea   Metformin Diarrhea   Angiotensin Receptor Blockers Other (See Comments)    Hyperkalemia   Melatonin Anxiety    Delirium after melatonin    REVIEW OF SYSTEMS:   ROS As per history of present illness. All pertinent systems were reviewed above. Constitutional, HEENT, cardiovascular, respiratory, GI, GU, musculoskeletal, neuro, psychiatric, endocrine, integumentary and hematologic systems were reviewed and are otherwise negative/unremarkable except for positive findings mentioned above in the HPI.   MEDICATIONS AT HOME:   Prior to Admission medications   Medication Sig  Start Date End Date Taking? Authorizing Provider  acetaminophen  (TYLENOL ) 500 MG tablet Take 500 mg by mouth every 6 (six) hours as needed for moderate pain.     [provider]  amoxicillin -clavulanate (AUGMENTIN ) 875-125 MG tablet Take 1 tablet by mouth every 12 (twelve) hours. 12/01/16   Patel, Sona, MD  cilostazol  (PLETAL ) 100 MG tablet Take 100 mg by mouth 2 (two) times daily.    [provider]  insulin  glargine (LANTUS ) 100 UNIT/ML injection Inject 12 Units into the skin at bedtime.     [provider]  mirtazapine  (REMERON ) 15 MG tablet Take 15 mg by mouth at bedtime.    [provider]  omeprazole (PRILOSEC) 20 MG capsule Take 20 mg by mouth daily.    [provider]  OXcarbazepine  (TRILEPTAL ) 150 MG tablet Take 150 mg by mouth at bedtime.    [provider]  oxyCODONE -acetaminophen  (PERCOCET) 5-325 MG tablet Take 1 tablet by mouth every 4 (four) hours as needed for severe pain. 05/29/18   Harlee Lynwood LABOR, MD  Pancrelipase , Lip-Prot-Amyl, 6000 units CPEP Take 6,000 Units by mouth 3 (three) times daily with meals.    [provider]  Vitamin D , Ergocalciferol , (DRISDOL ) 50000 units CAPS capsule Take 50,000 Units by mouth 2 (two) times a week. On Tuesday and Thursday    [provider]      VITAL SIGNS:  Blood pressure (!) 168/82, pulse (!) 107, temperature 97.6 F (36.4 C), temperature source Oral, resp. rate 18, weight 43.6 kg, SpO2 100%.  PHYSICAL EXAMINATION:  Physical Exam  GENERAL:  58 y.o.-year-old patient lying in the bed with no acute distress.  EYES: Pupils equal, round, reactive to light and accommodation. No scleral icterus. Extraocular muscles intact.  HEENT: Head atraumatic, normocephalic. Oropharynx and nasopharynx clear.  NECK:  Supple, no jugular venous distention. No thyroid enlargement, no tenderness.  LUNGS: Normal breath sounds bilaterally, no wheezing, rales,rhonchi or crepitation. No use of accessory muscles of respiration.  CARDIOVASCULAR: Regular rate and rhythm, S1, S2 normal. No murmurs, rubs, or gallops.  ABDOMEN: Soft, nondistended, nontender. Bowel sounds present. No organomegaly or mass.  He has a suprapubic intact catheter. EXTREMITIES: He is status post bilateral AKA. NEUROLOGIC: Cranial nerves II through XII are intact. Muscle strength 5/5 in all extremities. Sensation intact. Gait not checked.  PSYCHIATRIC: The patient is alert and oriented x 3.  Normal affect and good eye  contact. SKIN: No obvious rash, lesion, or ulcer.   LABORATORY PANEL:   CBC Recent Labs  Lab 11/05/24 2339  WBC 5.4  HGB 11.9*  HCT 36.5*  PLT 354   ------------------------------------------------------------------------------------------------------------------  Chemistries  Recent Labs  Lab 11/05/24 2339  NA 133*  K 4.4  CL 96*  CO2 23  GLUCOSE 208*  BUN 22*  CREATININE 1.70*  CALCIUM 10.0  AST 26  ALT 23  ALKPHOS 127*  BILITOT 0.4   ------------------------------------------------------------------------------------------------------------------  Cardiac Enzymes No results for input(s): TROPONINI in the last 168 hours. ------------------------------------------------------------------------------------------------------------------  RADIOLOGY:  DG Chest 2 View Result Date: 11/06/2024 EXAM: 2 VIEW(S) XRAY OF THE CHEST 11/06/2024 02:00:48 AM COMPARISON: None available. CLINICAL HISTORY: shortness of breath FINDINGS: LUNGS AND PLEURA: Mild elevation left hemidiaphragm. No focal pulmonary opacity. No pleural effusion. No pneumothorax. HEART AND MEDIASTINUM: No acute abnormality of the cardiac and mediastinal silhouettes. BONES AND SOFT TISSUES: No acute osseous abnormality. IMPRESSION: 1. Mild elevation of the left hemidiaphragm. Electronically signed by: Dorethia Molt MD 11/06/2024 02:22 AM EST RP Workstation: [id number]  CT HEAD WO CONTRAST ( ) Result Date: 11/06/2024 EXAM: CT HEAD WITHOUT CONTRAST 11/06/2024 01:57:06 AM TECHNIQUE: CT of the head was performed without the administration of intravenous contrast. Automated exposure control, iterative reconstruction, and/or weight based adjustment of the mA/kV was utilized to reduce the radiation dose to as low as reasonably achievable. COMPARISON: None available. 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 No acute hemorrhage. No  evidence of acute infarct. No hydrocephalus. No extra-axial collection. No mass effect or midline shift. ORBITS: No acute abnormality. SINUSES: No acute abnormality. SOFT TISSUES AND SKULL: No acute soft tissue abnormality. No skull fracture. IMPRESSION: 1. No acute intracranial abnormality. 2. 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 Electronically signed by: Dorethia Molt MD 11/06/2024 02:22 AM EST RP Workstation: [id number]      IMPRESSION AND PLAN:  Assessment and Plan: * Acute metabolic encephalopathy - This is like secondary to acute lower UTI. - The patient will be admitted to a medical telemetry bed. - Will follow neurochecks every 4 hours for 24 hours. - Management otherwise as below. - May be placed on as needed IM Haldol for agitation.  Acute lower UTI - The patient will be placed on IV Zosyn  given previous history of resistant UTIs. - Will follow urine culture.  Acute kidney injury superimposed on chronic kidney disease - This is likely secondary to volume depletion and dehydration. - Will continue hydration with IV saline and follow BMP. - Will avoid nephrotoxins.  Essential hypertension - Will continue antihypertensive therapy.  GERD without esophagitis - Continue PPI therapy.  Dyslipidemia - Will continue statin therapy.  Peripheral vascular disease - Continue Pletal .  Seizure disorder (HCC) - Will continue his Trileptal .    DVT prophylaxis: Lovenox .  Advanced Care Planning:  Code Status: full code.  Family Communication:  The plan of care was discussed in details with the patient (and family). I answered all questions. The patient agreed to proceed with the above mentioned plan. Further management will depend upon hospital course. Disposition Plan: Back to previous home environment Consults called: none.  All the records are reviewed and case discussed with ED provider.  Status is: Inpatient  At the time of the  admission, it appears that the appropriate admission status for this patient is inpatient.  This is judged to be reasonable and necessary in order to provide the required intensity of service to ensure the patient's safety given the presenting symptoms, physical exam findings and initial radiographic and laboratory data in the context of comorbid conditions.  The patient requires inpatient status due to high intensity of service, high risk of further deterioration and high frequency of surveillance required.  I certify that at the time of admission, it is my clinical judgment that the patient will require inpatient hospital care extending more than 2 midnights.                            Dispo: The patient is from: Home              Anticipated d/c is to: Home              Patient currently is not medically stable to d/c.              Difficult to place patient: No  [place] DELENA Peaches M.D on 11/06/2024 at 6:11 AM  [facility]   From 7 PM-7 AM,  contact night-coverage [email or web address]  CC: Primary care physician; Pcp, No

## 2024-11-06 NOTE — Plan of Care (Signed)
  Problem: Clinical Measurements: Goal: Ability to maintain clinical measurements within normal limits will improve Outcome: Progressing Goal: Respiratory complications will improve Outcome: Progressing   Problem: Activity: Goal: Risk for activity intolerance will decrease Outcome: Progressing   Problem: Elimination: Goal: Will not experience complications related to bowel motility Outcome: Progressing   Problem: Health Behavior/Discharge Planning: Goal: Ability to manage health-related needs will improve Outcome: Not Progressing   Problem: Nutrition: Goal: Adequate nutrition will be maintained Outcome: Not Progressing

## 2024-11-06 NOTE — Plan of Care (Signed)
 Patient has been anxious today. Medication, decreased stimulation, music, and back rubs help him to calm down.   Problem: Education: Goal: Knowledge of General Education information will improve Description: Including pain rating scale, medication(s)/side effects and non-pharmacologic comfort measures Outcome: Progressing   Problem: Health Behavior/Discharge Planning: Goal: Ability to manage health-related needs will improve Outcome: Progressing   Problem: Clinical Measurements: Goal: Ability to maintain clinical measurements within normal limits will improve Outcome: Progressing Goal: Will remain free from infection Outcome: Progressing Goal: Diagnostic test results will improve Outcome: Progressing Goal: Respiratory complications will improve Outcome: Progressing Goal: Cardiovascular complication will be avoided Outcome: Progressing   Problem: Activity: Goal: Risk for activity intolerance will decrease Outcome: Progressing   Problem: Nutrition: Goal: Adequate nutrition will be maintained Outcome: Progressing   Problem: Coping: Goal: Level of anxiety will decrease Outcome: Progressing   Problem: Elimination: Goal: Will not experience complications related to bowel motility Outcome: Progressing Goal: Will not experience complications related to urinary retention Outcome: Progressing   Problem: Pain Managment: Goal: General experience of comfort will improve and/or be controlled Outcome: Progressing   Problem: Safety: Goal: Ability to remain free from injury will improve Outcome: Progressing   Problem: Skin Integrity: Goal: Risk for impaired skin integrity will decrease Outcome: Progressing

## 2024-11-06 NOTE — ED Notes (Signed)
 Pt still rocking back and forth, asking for help but not mentioning what he needs help with. Pt ABCs intact. RR even and unlabored. Pt in NAD. Bed in lowest locked position.

## 2024-11-06 NOTE — Hospital Course (Signed)
 Timothy Clayton is a 58 y.o. African-American male with medical history significant for type 2 diabetes mellitus, pancreatitis, CKD, and bilateral AKA with suprapubic catheter, who presented to the emergency room with acute onset of failure to thrive.  The patient has not been taking his medications over the last 4 days and has been smoking marijuana and using opioids.  Patient also very confused and agitated, UA was abnormal, urine culture sent out.  Initially placed on Zosyn , changed to Rocephin .  However, patient previously had a multiresistant E. coli in the urine, antibiotic changed to meropenem on 11/18.

## 2024-11-06 NOTE — Assessment & Plan Note (Signed)
-   Will continue his Trileptal .

## 2024-11-06 NOTE — Progress Notes (Addendum)
 Progress Note   Patient: Timothy Clayton FMW:[id number] DOB: 23-Sep-1966 DOA: 11/05/2024     0 DOS: the patient was seen and examined on 11/06/2024   Brief hospital course: Timothy Clayton is a 58 y.o. African-American male with medical history significant for type 2 diabetes mellitus, pancreatitis, CKD, and bilateral AKA with suprapubic catheter, who presented to the emergency room with acute onset of failure to thrive.  The patient has not been taking his medications over the last 4 days and has been smoking marijuana and using opioids.  Patient also very confused and agitated, UA was abnormal, urine culture sent out.  Initially placed on Zosyn , changed to Rocephin .  However, patient previously had a multiresistant E. coli in the urine, antibiotic changed to meropenem on 11/18.   Principal Problem:   Acute metabolic encephalopathy Active Problems:   Acute lower UTI   Essential hypertension   GERD without esophagitis   Dyslipidemia   Seizure disorder (HCC)   Peripheral vascular disease   CKD stage 3a, GFR 45-59 ml/min (HCC)   Protein-calorie malnutrition, severe   Failure to thrive in adult   Assessment and Plan: * Acute metabolic encephalopathy Complicated urinary tract infection with suprapubic catheter. Discussed with patient son, currently lives at home by himself, he has history of opioid abuse, oxycodone  was discontinued 2 weeks ago.  Patient started have agitation about 2 or 3 days ago, he previously had a similar presentation, normal associated with UTI.  Previous culture grow E. coli with multi resistance, I will change antibiotic to meropenem. Urine culture was not sent prior to antibiotics. Asked lab to culture the urine from last collection when UA was performed. Patient urine drug screen only showed marijuana, no other drugs identified.  Altered mental status probably due to UTI.  Oxycodone  withdrawal is less likely. For his agitation, will start as needed Haldol, also add  Seroquel nightly.  Opioids addiction and abuse. Oxycodone  was discontinued by PCP 2 weeks ago.  Discussed with patient's son, prefer not to put him back on oxycodone .  We will follow.  Failure to thrive. Status post bilateral AKA. Anorexia. Patient was able to get around with a wheelchair at home.  Has not been eating for the last 3 or 4 days, very poor appetite.  Increased weakness. Will start Protonix , add Ensure  Chronic kidney disease stage IIIa. AKI ruled out. Reviewed chart, patient has slightly worsening renal function, but does not meet criteria for AKI.  Essential hypertension Continue home treatment.  GERD without esophagitis Increase Protonix  to twice a day.  Dyslipidemia - Will continue statin therapy.  Peripheral vascular disease - Continue Pletal .  Seizure disorder (HCC) - Will continue his Trileptal .       Subjective:  Patient is very confused and agitated.  He also has a very poor appetite.  Physical Exam: Vitals:   11/05/24 2232 11/06/24 0316 11/06/24 0455 11/06/24 0501  BP: (!) 149/93 (!) 160/121 (!) 168/82   Pulse: (!) 57 89 (!) 107   Resp: 17 18    Temp: 97.6 F (36.4 C)  97.6 F (36.4 C)   TempSrc: Oral  Oral   SpO2: 95% 100%    Weight:    43.6 kg   General exam: Appears calm and comfortable  Respiratory system: Clear to auscultation. Respiratory effort normal. Cardiovascular system: S1 & S2 heard, RRR. No JVD, murmurs, rubs, gallops or clicks. No pedal edema. Gastrointestinal system: Abdomen is nondistended, soft and nontender. No organomegaly or masses felt. Normal bowel sounds heard.  Central nervous system: Confused and agitated. No focal neurological deficits. Extremities: Status post bilateral AKA. Skin: No rashes, lesions or ulcers Psychiatry: Flat affect with agitation   Data Reviewed:  Lab results reviewed.  CT head without acute changes.  Family Communication: Son updated over the phone.  Disposition: Status is:  Inpatient Remains inpatient appropriate because: Severity of disease, IV treatment, altered mental status.     Time spent: No charge  Author: Murvin Mana, MD 11/06/2024 11:14 AM  For on call review [email or web address].

## 2024-11-06 NOTE — ED Notes (Signed)
 Pt rocking back and forth in stretcher, appears uncomfortable, but keeps asking RN to sit up when he is sitting up.

## 2024-11-06 NOTE — ED Notes (Signed)
 After 2 failed attempts, this RN went with pt to get CT scan and Xray.

## 2024-11-06 NOTE — ED Notes (Signed)
 Pt observed scratching at wound to R arm after old Mepilex padding came off. RN replaced Mepilex pad and applied barrier ointment to site.

## 2024-11-06 NOTE — Assessment & Plan Note (Signed)
-   This is likely secondary to volume depletion and dehydration. - Will continue hydration with IV saline and follow BMP. - Will avoid nephrotoxins.

## 2024-11-06 NOTE — Assessment & Plan Note (Signed)
-   This is like secondary to acute lower UTI. - The patient will be admitted to a medical telemetry bed. - Will follow neurochecks every 4 hours for 24 hours. - Management otherwise as below. - May be placed on as needed IM Haldol for agitation.

## 2024-11-06 NOTE — ED Notes (Signed)
 Pt ABCs intact. RR even and unlabored. Pt anxious. Bed in lowest locked position. Cpt stating that he is ready to go, but informed there is no where for him to go at this time.

## 2024-11-06 NOTE — Assessment & Plan Note (Signed)
 Will continue statin therapy

## 2024-11-06 NOTE — ED Notes (Signed)
 Pt continues to be anxious, rocking back and forth in bed.

## 2024-11-06 NOTE — Assessment & Plan Note (Addendum)
-   The patient will be placed on IV Zosyn  given previous history of resistant UTIs. - Will follow urine culture.

## 2024-11-06 NOTE — Assessment & Plan Note (Signed)
-  Continue Pletal 

## 2024-11-06 NOTE — ED Notes (Signed)
 Patient finally calm, and resting comfortably in stretcher. Pt PIV re-secured with new dressing and gauze.

## 2024-11-06 NOTE — ED Notes (Signed)
 Pt transported to inpatient room with this RN and with all belongings. Pt more comfortable than before but still slightly anxious. Pt transitioned to inpatient bed with help of inpatient RN, brief bedside report given. Pt Pt ABCs intact. RR even and unlabored. Pt in NAD.

## 2024-11-06 NOTE — ED Notes (Signed)
 RN informed that pt was unwilling to cooperate with laying down for CT scan and brought back.

## 2024-11-06 NOTE — Progress Notes (Signed)
 Patient noted to be rocking back in forth in bed and making abrupt movements like throwing his body backwards. Patient acknowledged nurse navigator's presence, smiled and said hi. Per sitter patient previously not vocally engaging with anyone. Patient able to verbalize he is anxious and scared. Patient reassured he is in a safe place. Assisted several times with finding a comfortable position in bed. Soothing music turned on and emotional support provided. Rails padded x4 and floor mat placed on opposing side from sitter for safety. 1:1 sitter remains.

## 2024-11-06 NOTE — Assessment & Plan Note (Signed)
 Continue PPI therapy.

## 2024-11-07 DIAGNOSIS — G9341 Metabolic encephalopathy: Secondary | ICD-10-CM | POA: Diagnosis not present

## 2024-11-07 LAB — CBC
HCT: 39.9 % (ref 39.0–52.0)
Hemoglobin: 13.2 g/dL (ref 13.0–17.0)
MCH: 29.2 pg (ref 26.0–34.0)
MCHC: 33.1 g/dL (ref 30.0–36.0)
MCV: 88.3 fL (ref 80.0–100.0)
Platelets: 359 K/uL (ref 150–400)
RBC: 4.52 MIL/uL (ref 4.22–5.81)
RDW: 17.2 % — ABNORMAL HIGH (ref 11.5–15.5)
WBC: 6.4 K/uL (ref 4.0–10.5)
nRBC: 0 % (ref 0.0–0.2)

## 2024-11-07 LAB — BLOOD GAS, VENOUS
Acid-Base Excess: 3.2 mmol/L — ABNORMAL HIGH (ref 0.0–2.0)
Bicarbonate: 26.9 mmol/L (ref 20.0–28.0)
O2 Saturation: 46.7 %
Patient temperature: 37
pCO2, Ven: 37 mmHg — ABNORMAL LOW (ref 44–60)
pH, Ven: 7.47 — ABNORMAL HIGH (ref 7.25–7.43)

## 2024-11-07 LAB — BASIC METABOLIC PANEL WITH GFR
Anion gap: 14 (ref 5–15)
BUN: 15 mg/dL (ref 6–20)
CO2: 22 mmol/L (ref 22–32)
Calcium: 9.3 mg/dL (ref 8.9–10.3)
Chloride: 105 mmol/L (ref 98–111)
Creatinine, Ser: 1.21 mg/dL (ref 0.61–1.24)
GFR, Estimated: >60 mL/min (ref >60)
Glucose, Bld: 60 mg/dL — ABNORMAL LOW (ref 70–99)
Potassium: 4.2 mmol/L (ref 3.5–5.1)
Sodium: 140 mmol/L (ref 135–145)

## 2024-11-07 LAB — GLUCOSE, CAPILLARY
Glucose-Capillary: 50 mg/dL — ABNORMAL LOW (ref 70–99)
Glucose-Capillary: 51 mg/dL — ABNORMAL LOW (ref 70–99)
Glucose-Capillary: 217 mg/dL — ABNORMAL HIGH (ref 70–99)
Glucose-Capillary: 120 mg/dL — ABNORMAL HIGH (ref 70–99)
Glucose-Capillary: 338 mg/dL — ABNORMAL HIGH (ref 70–99)
Glucose-Capillary: 295 mg/dL — ABNORMAL HIGH (ref 70–99)

## 2024-11-07 LAB — MAGNESIUM: Magnesium: 2.1 mg/dL (ref 1.7–2.4)

## 2024-11-07 LAB — MISC LABCORP TEST (SEND OUT): Labcorp test code: 83935

## 2024-11-07 MED ORDER — DEXTROSE 50 % IV SOLN
25.0000 g | INTRAVENOUS | Status: AC
  Administered 2024-11-07: 25 g via INTRAVENOUS
  Filled 2024-11-07: qty 50

## 2024-11-07 MED ORDER — GLUCAGON HCL RDNA (DIAGNOSTIC) 1 MG IJ SOLR: 1.0000 mg | Freq: Once | INTRAMUSCULAR | Status: DC | PRN

## 2024-11-07 NOTE — Progress Notes (Addendum)
 Progress Note    Timothy Clayton  FMW:[id number] DOB: September 07, 1966  DOA: 11/05/2024 PCP: Pcp, No      Brief Narrative:    Medical records reviewed and are as summarized below:  Timothy Clayton is a 58 y.o. male with medical history significant for type II DM, pancreatitis, CKD, bilateral AKA, suprapubic catheter in place, who presented to the hospital because of refusal to eat and take medications.  Reportedly, he had not been eating for 4 days and had only been using marijuana and opioids.  He was confused and agitated.  Urinalysis was concerning for acute UTI.  His son requested that patient be placed at an assisted living facility.      Assessment/Plan:   Principal Problem:   Acute metabolic encephalopathy Active Problems:   Acute lower UTI   Essential hypertension   GERD without esophagitis   Dyslipidemia   Seizure disorder (HCC)   Peripheral vascular disease   CKD stage 3a, GFR 45-59 ml/min (HCC)   Protein-calorie malnutrition, severe   Failure to thrive in adult   Body mass index is 14.62 kg/m.  (Underweight)   Acute complicated UTI, underlying neurogenic bladder with suprapubic catheter in place: Continue IV meropenem.  Follow-up urine culture.   Insulin -dependent diabetes mellitus, hypoglycemia: Glucose dropped to 50.  He was given injection glucagon for hypoglycemia today.  He has been refusing to eat or take medications.  IV dextrose  as needed to injection glucagon as needed for hypoglycemia.   Hold Lantus . Hemoglobin A1c 9.1 on 10/17/2024.   Acute metabolic encephalopathy: Patient is anxious and agitated. Continue supportive care History of delirium and agitation.   Poor oral intake, general weakness, severe protein calorie malnutrition, failure to thrive, s/p bilateral AKA: PT evaluation.  Encourage adequate oral intake.   Opioid use disorder: Reportedly, patient was on oxycodone  at home but this was discontinued by his PCP about 2 weeks prior to  admission.    Comorbidities include CKD stage IV (saw his nephrologist on 10/17/2024), seizure disorder on Trileptal , PVD, CAD, stroke, dyslipidemia, hypertension, substance use disorder (alcohol, tobacco, marijuana), chronic pancreatitis   Diet Order             Diet Heart Room service appropriate? Yes; Fluid consistency: Thin  Diet effective now                                  Consultants: None  Procedures: None    Medications:    Chlorhexidine Gluconate Cloth  6 each Topical Daily   cilostazol   100 mg Oral BID   enoxaparin  (LOVENOX ) injection  30 mg Subcutaneous Q24H   feeding supplement  237 mL Oral BID BM   lipase/protease/amylase  12,000 Units Oral TID WC   mirtazapine   15 mg Oral QHS   nicotine   21 mg Transdermal Daily   OXcarbazepine   150 mg Oral QHS   pantoprazole   40 mg Oral BID   QUEtiapine  25 mg Oral QHS   [START ON 11/08/2024] Vitamin D  (Ergocalciferol )  50,000 Units Oral Once per day on Monday Thursday   Continuous Infusions:  meropenem (MERREM) IV 1 g (11/07/24 0937)     Anti-infectives (From admission, onward)    Start     Dose/Rate Route Frequency Ordered Stop   11/06/24 1000  meropenem (MERREM) 1 g in sodium chloride  0.9 % 100 mL IVPB        1 g 200 mL/hr  over 30 Minutes Intravenous Every 12 hours 11/06/24 0750     11/06/24 0600  cefTRIAXone  (ROCEPHIN ) 2 g in sodium chloride  0.9 % 100 mL IVPB  Status:  Discontinued        2 g 200 mL/hr over 30 Minutes Intravenous Every 24 hours 11/06/24 0513 11/06/24 0749   11/06/24 0215  piperacillin -tazobactam (ZOSYN ) IVPB 3.375 g        3.375 g 100 mL/hr over 30 Minutes Intravenous  Once 11/06/24 0210 11/06/24 0253              Family Communication/Anticipated D/C date and plan/Code Status   DVT prophylaxis: enoxaparin  (LOVENOX ) injection 30 mg Start: 11/07/24 0800     Code Status: Full Code  Family Communication: None Disposition Plan: Plan to discharge  home   Status is: Inpatient Remains inpatient appropriate because: Encephalopathy, UTI, hypoglycemia       Subjective:   Interval events noted.  He is confused and cannot provide any history.  Glucose dropped to 50 this morning.  Sitter at the bedside.  Objective:    Vitals:   11/06/24 1833 11/06/24 1953 11/07/24 0349 11/07/24 0717  BP:  (!) 188/91 (!) 164/88 (!) 199/88  Pulse:  97 99 96  Resp:  17 18 18   Temp: 98.6 F (37 C)  01.5 F (36.9 C) 98.4 F (36.9 C)  TempSrc: Oral   Oral  SpO2: 100% 100% 100% 100%  Weight:      Height:       No data found.   Intake/Output Summary (Last 24 hours) at 11/07/2024 1608 Last data filed at 11/07/2024 0900 Gross per 24 hour  Intake 0 ml  Output 2200 ml  Net -2200 ml   Filed Weights   11/06/24 0501  Weight: 43.6 kg    Exam:  GEN: NAD SKIN: Warm and dry EYES: No pallor or icterus ENT: MMM CV: RRR PULM: CTA B ABD: soft, ND, NT, +BS CNS: Alert but confused and agitated EXT: Bilateral AKA GU: Suprapubic catheter in place, draining amber urine       Data Reviewed:   I have personally reviewed following labs and imaging studies:  Labs: Labs show the following:   Basic Metabolic Panel: Recent Labs  Lab 11/05/24 2339 11/07/24 0434  NA 133* 140  K 4.4 4.2  CL 96* 105  CO2 23 22  GLUCOSE 208* 60*  BUN 22* 15  CREATININE 1.70* 1.21  CALCIUM 10.0 9.3  MG  --  2.1   GFR Estimated Creatinine Clearance: 40 mL/min (by C-G formula based on SCr of 1.21 mg/dL). Liver Function Tests: Recent Labs  Lab 11/05/24 2339  AST 26  ALT 23  ALKPHOS 127*  BILITOT 0.4  PROT 7.5  ALBUMIN 4.0   Recent Labs  Lab 11/05/24 2339  LIPASE <10*   Recent Labs  Lab 11/05/24 2339  AMMONIA 18   Coagulation profile No results for input(s): INR, PROTIME in the last 168 hours.  CBC: Recent Labs  Lab 11/05/24 2339 11/07/24 0434  WBC 5.4 6.4  NEUTROABS 2.8  --   HGB 11.9* 13.2  HCT 36.5* 39.9  MCV 88.4 88.3   PLT 354 359   Cardiac Enzymes: No results for input(s): CKTOTAL, CKMB, CKMBINDEX, TROPONINI in the last 168 hours. BNP (last 3 results) No results for input(s): PROBNP in the last 8760 hours. CBG: Recent Labs  Lab 11/05/24 2339 11/07/24 0902 11/07/24 0927 11/07/24 0946 11/07/24 1152  GLUCAP 172* 51* 50* 217* 120*  D-Dimer: No results for input(s): DDIMER in the last 72 hours. Hgb A1c: No results for input(s): HGBA1C in the last 72 hours. Lipid Profile: No results for input(s): CHOL, HDL, LDLCALC, TRIG, CHOLHDL, LDLDIRECT in the last 72 hours. Thyroid function studies: No results for input(s): TSH, T4TOTAL, T3FREE, THYROIDAB in the last 72 hours.  Invalid input(s): FREET3 Anemia work up: No results for input(s): VITAMINB12, FOLATE, FERRITIN, TIBC, IRON, RETICCTPCT in the last 72 hours. Sepsis Labs: Recent Labs  Lab 11/05/24 2339 11/07/24 0434  WBC 5.4 6.4    Microbiology Recent Results (from the past 240 hours)  Resp panel by RT-PCR (RSV, Flu A&B, Covid) Anterior Nasal Swab     Status: None   Collection Time: 11/05/24 11:39 PM   Specimen: Anterior Nasal Swab  Result Value Ref Range Status   SARS Coronavirus 2 by RT PCR NEGATIVE NEGATIVE Final    Comment: (NOTE) SARS-CoV-2 target nucleic acids are NOT DETECTED.  The SARS-CoV-2 RNA is generally detectable in upper respiratory specimens during the acute phase of infection. The lowest concentration of SARS-CoV-2 viral copies this assay can detect is 138 copies/mL. A negative result does not preclude SARS-Cov-2 infection and should not be used as the sole basis for treatment or other patient management decisions. A negative result may occur with  improper specimen collection/handling, submission of specimen other than nasopharyngeal swab, presence of viral mutation(s) within the areas targeted by this assay, and inadequate number of viral copies(<138 copies/mL). A  negative result must be combined with clinical observations, patient history, and epidemiological information. The expected result is Negative.  Fact Sheet for Patients:  bloggercourse.com  Fact Sheet for Healthcare Providers:  seriousbroker.it  This test is no t yet approved or cleared by the [place]  FDA and  has been authorized for detection and/or diagnosis of SARS-CoV-2 by FDA under an Emergency Use Authorization (EUA). This EUA will remain  in effect (meaning this test can be used) for the duration of the COVID-19 declaration under Section 564(b)(1) of the Act, 21 U.S.C.section 360bbb-3(b)(1), unless the authorization is terminated  or revoked sooner.       Influenza A by PCR NEGATIVE NEGATIVE Final   Influenza B by PCR NEGATIVE NEGATIVE Final    Comment: (NOTE) The Xpert Xpress SARS-CoV-2/FLU/RSV plus assay is intended as an aid in the diagnosis of influenza from Nasopharyngeal swab specimens and should not be used as a sole basis for treatment. Nasal washings and aspirates are unacceptable for Xpert Xpress SARS-CoV-2/FLU/RSV testing.  Fact Sheet for Patients: bloggercourse.com  Fact Sheet for Healthcare Providers: seriousbroker.it  This test is not yet approved or cleared by the [place]  FDA and has been authorized for detection and/or diagnosis of SARS-CoV-2 by FDA under an Emergency Use Authorization (EUA). This EUA will remain in effect (meaning this test can be used) for the duration of the COVID-19 declaration under Section 564(b)(1) of the Act, 21 U.S.C. section 360bbb-3(b)(1), unless the authorization is terminated or revoked.     Resp Syncytial Virus by PCR NEGATIVE NEGATIVE Final    Comment: (NOTE) Fact Sheet for Patients: bloggercourse.com  Fact Sheet for Healthcare  Providers: seriousbroker.it  This test is not yet approved or cleared by the [place]  FDA and has been authorized for detection and/or diagnosis of SARS-CoV-2 by FDA under an Emergency Use Authorization (EUA). This EUA will remain in effect (meaning this test can be used) for the duration of the COVID-19 declaration under Section 564(b)(1) of the Act, 21 U.S.C. section  360bbb-3(b)(1), unless the authorization is terminated or revoked.  Performed at [facility], [address] [place], [place] [address]     Procedures and diagnostic studies:  DG Chest 2 View Result Date: 11/06/2024 EXAM: 2 VIEW(S) XRAY OF THE CHEST 11/06/2024 02:00:48 AM COMPARISON: None available. CLINICAL HISTORY: shortness of breath FINDINGS: LUNGS AND PLEURA: Mild elevation left hemidiaphragm. No focal pulmonary opacity. No pleural effusion. No pneumothorax. HEART AND MEDIASTINUM: No acute abnormality of the cardiac and mediastinal silhouettes. BONES AND SOFT TISSUES: No acute osseous abnormality. IMPRESSION: 1. Mild elevation of the left hemidiaphragm. Electronically signed by: Dorethia Molt MD 11/06/2024 02:22 AM EST RP Workstation: [id number]   CT HEAD WO CONTRAST ( ) Result Date: 11/06/2024 EXAM: CT HEAD WITHOUT CONTRAST 11/06/2024 01:57:06 AM TECHNIQUE: CT of the head was performed without the administration of intravenous contrast. Automated exposure control, iterative reconstruction, and/or weight based adjustment of the mA/kV was utilized to reduce the radiation dose to as low as reasonably achievable. COMPARISON: None available. 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 No acute hemorrhage. No evidence of acute infarct. No hydrocephalus. No extra-axial collection. No mass effect or midline shift. ORBITS: No acute abnormality. SINUSES: No acute abnormality. SOFT TISSUES AND SKULL: No  acute soft tissue abnormality. No skull fracture. IMPRESSION: 1. No acute intracranial abnormality. 2. 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 Electronically signed by: Dorethia Molt MD 11/06/2024 02:22 AM EST RP Workstation: [id number]               LOS: 1 day   Atlas Kuc  [facility]   Pager on [email or web address]. If 7PM-7AM, please contact night-coverage at [email or web address]     11/07/2024, 4:08 PM

## 2024-11-07 NOTE — Plan of Care (Signed)

## 2024-11-08 DIAGNOSIS — G9341 Metabolic encephalopathy: Secondary | ICD-10-CM | POA: Diagnosis not present

## 2024-11-08 LAB — URINE CULTURE

## 2024-11-08 LAB — GLUCOSE, CAPILLARY
Glucose-Capillary: 180 mg/dL — ABNORMAL HIGH (ref 70–99)
Glucose-Capillary: 163 mg/dL — ABNORMAL HIGH (ref 70–99)
Glucose-Capillary: 221 mg/dL — ABNORMAL HIGH (ref 70–99)
Glucose-Capillary: 196 mg/dL — ABNORMAL HIGH (ref 70–99)

## 2024-11-08 MED ORDER — PREGABALIN 50 MG PO CAPS
50.0000 mg | ORAL_CAPSULE | Freq: Two times a day (BID) | ORAL | Status: DC
  Administered 2024-11-08 – 2024-11-10 (×5): 50 mg via ORAL
  Filled 2024-11-08 (×5): qty 1

## 2024-11-08 MED ORDER — SERTRALINE HCL 50 MG PO TABS
25.0000 mg | ORAL_TABLET | Freq: Every day | ORAL | Status: DC
  Administered 2024-11-08 – 2024-11-10 (×3): 25 mg via ORAL
  Filled 2024-11-08 (×3): qty 1

## 2024-11-08 MED ORDER — BUSPIRONE HCL 10 MG PO TABS
5.0000 mg | ORAL_TABLET | Freq: Every day | ORAL | Status: DC
  Administered 2024-11-08 – 2024-11-09 (×2): 5 mg via ORAL
  Filled 2024-11-08 (×2): qty 1

## 2024-11-08 MED ORDER — OLANZAPINE 5 MG PO TABS
5.0000 mg | ORAL_TABLET | Freq: Every day | ORAL | Status: DC
  Administered 2024-11-08 – 2024-11-09 (×2): 5 mg via ORAL
  Filled 2024-11-08 (×2): qty 1

## 2024-11-08 MED ORDER — INSULIN ASPART 100 UNIT/ML IJ SOLN
0.0000 [IU] | Freq: Three times a day (TID) | INTRAMUSCULAR | Status: DC
  Administered 2024-11-08 – 2024-11-09 (×2): 2 [IU] via SUBCUTANEOUS
  Administered 2024-11-09: 1 [IU] via SUBCUTANEOUS
  Administered 2024-11-09: 2 [IU] via SUBCUTANEOUS
  Administered 2024-11-10: 1 [IU] via SUBCUTANEOUS
  Filled 2024-11-08: qty 1
  Filled 2024-11-08 (×3): qty 2
  Filled 2024-11-08: qty 1

## 2024-11-08 NOTE — Inpatient Diabetes Management (Signed)
 Inpatient Diabetes Program Recommendations  AACE/ADA: New Consensus Statement on Inpatient Glycemic Control (2015)  Target Ranges:  Prepandial:   less than 140 mg/dL      Peak postprandial:   less than 180 mg/dL (1-2 hours)      Critically ill patients:  140 - 180 mg/dL    Latest Reference Range & Units 11/07/24 09:02 11/07/24 09:27 11/07/24 09:46 11/07/24 11:52 11/07/24 17:05 11/07/24 19:35  Glucose-Capillary 70 - 99 mg/dL 51 (L) 50 (L)  782 (H) 120 (H) 338 (H) 295 (H)    Latest Reference Range & Units 11/08/24 08:00  Glucose-Capillary 70 - 99 mg/dL 819 (H)  (H): Data is abnormally high     Home DM Meds: Lantus  6 units at HS   Current Orders: Checking CBGs TID AC + HS    MD- Please consider starting Novolog  0-6 units TID AC    --Will follow patient during hospitalization--  Adina Rudolpho Arrow RN, MSN, CDCES Diabetes Coordinator Inpatient Glycemic Control Team Team Pager: [phone] (8a-5p)

## 2024-11-08 NOTE — Plan of Care (Signed)

## 2024-11-08 NOTE — Plan of Care (Signed)

## 2024-11-08 NOTE — TOC Initial Note (Signed)
 Transition of Care [facility]) - Initial/Assessment Note    Patient Details  Name: Timothy Clayton MRN: [id number] Date of Birth: Sep 22, 1966  [facility]) CM/SW Contact:    Lauraine JAYSON Carpen, LCSW Phone Number: 11/08/2024, 12:46 PM  Clinical Narrative:   CSW called son, introduced role, and explained that discharge planning would be discussed. Son lives [facility] so he has been looking for ALF placement in [facility] and [facility]. He requested a list of facilities in [place]. CSW sent in an email. CSW asked MD to consult PT and OT to determine if he meets criteria for SNF placement. No further concerns. CSW will continue to follow patient and his son for support and facilitate discharge once medically stable.  Expected Discharge Plan:  (TBD) Barriers to Discharge: Continued Medical Work up   Patient Goals and CMS Choice            Expected Discharge Plan and Services     Post Acute Care Choice:  (TBD) Living arrangements for the past 2 months: Single Family Home                                      Prior Living Arrangements/Services Living arrangements for the past 2 months: Single Family Home Lives with:: Self Patient language and need for interpreter reviewed:: Yes Do you feel safe going back to the place where you live?: Yes      Need for Family Participation in Patient Care: Yes (Comment) Care giver support system in place?: Yes (comment)   Criminal Activity/Legal Involvement Pertinent to Current Situation/Hospitalization: No - Comment as needed  Activities of Daily Living      Permission Sought/Granted Permission sought to share information with : Family Supports    Share Information with NAME: Illias Pantano     Permission granted to share info w Relationship: Son  Permission granted to share info w Contact Information: [phone]  Emotional Assessment Appearance:: Appears stated age Attitude/Demeanor/Rapport: Unable to  Assess Affect (typically observed): Unable to Assess Orientation: : Oriented to Self, Oriented to Place, Oriented to  Time Alcohol / Substance Use: Not Applicable Psych Involvement: No (comment)  Admission diagnosis:  Failure to thrive in adult [R62.7] Altered mental status, unspecified altered mental status type [R41.82] Acute metabolic encephalopathy [G93.41] Urinary tract infection associated with cystostomy catheter, initial encounter [T83.510A, N39.0] Patient Active Problem List   Diagnosis Date Noted   Acute metabolic encephalopathy 11/06/2024   Acute lower UTI 11/06/2024   Essential hypertension 11/06/2024   Dyslipidemia 11/06/2024   Seizure disorder (HCC) 11/06/2024   GERD without esophagitis 11/06/2024   Peripheral vascular disease 11/06/2024   CKD stage 3a, GFR 45-59 ml/min (HCC) 11/06/2024   Protein-calorie malnutrition, severe 11/06/2024   Failure to thrive in adult 11/06/2024   Colitis    Generalized abdominal pain    Alcohol-induced chronic pancreatitis (HCC)    Sepsis (HCC) 11/28/2016   PCP:  Pcp, No Pharmacy:  No Pharmacies Listed    Social Drivers of Health (SDOH) Social History: SDOH Screenings   Food Insecurity: No Food Insecurity (10/24/2024)   [facility]  Transportation Needs: No Transportation Needs (10/24/2024)   Received from [facility]  Utilities: Low Risk (01/19/2024)   Received from [facility]  Financial Resource Strain: Medium Risk (10/24/2024)   Received from [facility]  Physical Activity: Sufficiently Active (01/19/2024)   Received  from [facility]  Social Connections: Moderately Integrated (01/19/2024)   Received from [facility]  Stress: No Stress Concern Present (01/19/2024)   Received from [facility]  Tobacco Use: High Risk (11/05/2024)  Health Literacy: Low Risk (04/14/2024)   Received from [facility]   SDOH Interventions: Utilities Interventions: Patient Declined   Readmission  Risk Interventions     No data to display

## 2024-11-08 NOTE — TOC CM/SW Note (Signed)
 CSW called son, Bernardino, per RN request. He asked that CSW call him back in 15 minutes. CSW will call back after 12:00 meeting.  Lauraine Carpen, CSW [phone]

## 2024-11-08 NOTE — Evaluation (Signed)
 Physical Therapy Evaluation Patient Details Name: Camauri Craton MRN: [id number] DOB: 1966-06-06 Today's Date: 11/08/2024  History of Present Illness  Yasmin Dibello is a 58 y.o. African-American male with medical history significant for type 2 diabetes mellitus, pancreatitis, CKD, and bilateral AKA with suprapubic catheter, who presented to the emergency room with acute onset of failure to thrive. MD dx include acute metabolic encephalopathy, acute lower UTI, essential HTN, GERD without esophagitis, peripheral vascular disease, and seizure disorder.  Clinical Impression  Pt is pleasant 58 y.o. male admitted for acute metabolic encephalopathy. Pt is A&Ox4. Prior to hospitalization, pt IND with w/c mobility and transfers. Pt reports IND with ADLs. He has a PCA who is assists him with IADLs 5days/wk. This evaluation, pt able to perform anterior-posterior transfer with supervision and wheelchair mobility with supervision. Pt also mod I with bed mobility. Pt demonstrates all bed mobility/transfers/wc mobility at baseline level. Pt does not require any further PT needs at this time. Pt will be dc in house and does not require follow up. Will dc current orders.         If plan is discharge home, recommend the following: A little help with walking and/or transfers;Supervision due to cognitive status   Can travel by private vehicle        Equipment Recommendations None recommended by PT (pt has needed DME)  Recommendations for Other Services       Functional Status Assessment Patient has not had a recent decline in their functional status     Precautions / Restrictions Precautions Precautions: Fall Recall of Precautions/Restrictions: Impaired Restrictions Weight Bearing Restrictions Per Provider Order: No      Mobility  Bed Mobility Overal bed mobility: Modified Independent             General bed mobility comments: Use of bed features for supine<>sit.    Transfers Overall  transfer level: Needs assistance Equipment used: None Transfers: Bed to chair/wheelchair/BSC         Anterior-Posterior transfers: Supervision   General transfer comment: supervision to ensure safe set up. Pt transfers via Anterior-posterior transfer.    Ambulation/Gait               General Gait Details: pt unable  Administrator mobility: Yes Wheelchair propulsion: Both upper extremities Wheelchair parts: Supervision/cueing Distance: 120 Wheelchair Assistance Details (indicate cue type and reason): Cuing to avoid obstacles. Supervision provided to ensure safety with wc set up and transfer set up.   Tilt Bed    Modified Rankin (Stroke Patients Only)       Balance Overall balance assessment: No apparent balance deficits (not formally assessed)                                           Pertinent Vitals/Pain Pain Assessment Pain Assessment: No/denies pain    Home Living Family/patient expects to be discharged to:: Private residence Living Arrangements: Alone Available Help at Discharge: Family;Personal care attendant (PCA 5days/week) Type of Home: Apartment Home Access: Level entry       Home Layout: One level Home Equipment: Wheelchair - manual Additional Comments: per chart review pt has B prostheses but does not use them    Prior Function Prior Level of Function : Independent/Modified Independent  Mobility Comments: IND with transfers and use of manual wc ADLs Comments: IND     Extremity/Trunk Assessment   Upper Extremity Assessment Upper Extremity Assessment: Overall WFL for tasks assessed (elbow extension 3/4 (pt compensated with use of abdominal muscles. No real resistance from UEs.), 4/5 grip strength, 4/5 elbow flexion)    Lower Extremity Assessment Lower Extremity Assessment: Overall WFL for tasks assessed (B AKA)    Cervical / Trunk  Assessment Cervical / Trunk Assessment: Normal  Communication   Communication Communication: No apparent difficulties    Cognition Arousal: Alert Behavior During Therapy: WFL for tasks assessed/performed   PT - Cognitive impairments: No apparent impairments                       PT - Cognition Comments: Pt A&Ox4. Pleasant and agreeable to PT session. Slightly impulsive. Following commands: Intact       Cueing Cueing Techniques: Verbal cues     General Comments      Exercises     Assessment/Plan    PT Assessment Patient does not need any further PT services  PT Problem List         PT Treatment Interventions      PT Goals (Current goals can be found in the Care Plan section)  Acute Rehab PT Goals Patient Stated Goal: none stated PT Goal Formulation: With patient Time For Goal Achievement: 11/22/24 Potential to Achieve Goals: Good    Frequency       Co-evaluation               AM-PAC PT 6 Clicks Mobility  Outcome Measure Help needed turning from your back to your side while in a flat bed without using bedrails?: None Help needed moving from lying on your back to sitting on the side of a flat bed without using bedrails?: None Help needed moving to and from a bed to a chair (including a wheelchair)?: A Little Help needed standing up from a chair using your arms (e.g., wheelchair or bedside chair)?: Total Help needed to walk in hospital room?: Total Help needed climbing 3-5 steps with a railing? : Total 6 Click Score: 14    End of Session   Activity Tolerance: Patient tolerated treatment well Patient left: in bed;with call bell/phone within reach;with bed alarm set;with nursing/sitter in room;with family/visitor present Nurse Communication: Mobility status PT Visit Diagnosis: Muscle weakness (generalized) (M62.81)    Time: 8491-8478 PT Time Calculation (min) (ACUTE ONLY): 13 min   Charges:                 Ayliana Casciano,  SPT   Nesanel Aguila 11/08/2024, 3:54 PM

## 2024-11-08 NOTE — Discharge Instructions (Signed)
 Some PCP options in [place] area- not a comprehensive list  [facility]- [phone] [facility]- [phone] [facility]- [phone] [facility]- [phone] [facility]- [phone] Nichole Molly- [phone]  or [facility] Physician Referral Line [phone]

## 2024-11-08 NOTE — Progress Notes (Signed)
 Progress Note    Timothy Clayton  FMW:[id number] DOB: 05/13/1966  DOA: 11/05/2024 PCP: Pcp, No      Brief Narrative:    Medical records reviewed and are as summarized below:  Timothy Clayton is a 58 y.o. male with medical history significant for type II DM, pancreatitis, CKD, bilateral AKA, suprapubic catheter in place, who presented to the hospital because of refusal to eat and take medications.  Reportedly, he had not been eating for 4 days and had only been using marijuana and opioids.  He was confused and agitated.  Urinalysis was concerning for acute UTI.  His son requested that patient be placed at an assisted living facility.      Assessment/Plan:   Principal Problem:   Acute metabolic encephalopathy Active Problems:   Acute lower UTI   Essential hypertension   GERD without esophagitis   Dyslipidemia   Seizure disorder (HCC)   Peripheral vascular disease   CKD stage 3a, GFR 45-59 ml/min (HCC)   Protein-calorie malnutrition, severe   Failure to thrive in adult   Body mass index is 14.62 kg/m.  (Underweight)   Acute complicated UTI, underlying neurogenic bladder with suprapubic catheter in place: Urine culture showed multiple mixed species.  Continue IV meropenem  for total of 5 days given history of multidrug-resistant E. coli.   Insulin -dependent diabetes mellitus, recent hypoglycemia: Hypoglycemia has improved.   Start low-dose NovoLog  sliding scale for hyperglycemia since patient has started eating. Will consider restarting Lantus  if glucose levels remain on the high side. Hemoglobin A1c 9.1 on 10/17/2024.   Acute metabolic encephalopathy: Improved.  Mental status is much better. History of delirium and agitation.   Poor oral intake, general weakness, severe protein calorie malnutrition, failure to thrive, s/p bilateral AKA: PT evaluation.  Encourage adequate oral intake.   Opioid use disorder: Reportedly, patient was on oxycodone  at home but this  was discontinued by his PCP about 2 weeks prior to admission.   General weakness: PT and OT evaluation.   Comorbidities include CKD stage IV (saw his nephrologist on 10/17/2024), seizure disorder on Trileptal , PVD, CAD, stroke, dyslipidemia, hypertension, substance use disorder (alcohol, tobacco, marijuana), chronic pancreatitis   Diet Order             Diet Heart Room service appropriate? Yes; Fluid consistency: Thin  Diet effective now                                  Consultants: None  Procedures: None    Medications:    busPIRone   5 mg Oral QHS   Chlorhexidine  Gluconate Cloth  6 each Topical Daily   cilostazol   100 mg Oral BID   enoxaparin  (LOVENOX ) injection  30 mg Subcutaneous Q24H   feeding supplement  237 mL Oral BID BM   lipase/protease/amylase  12,000 Units Oral TID WC   mirtazapine   15 mg Oral QHS   nicotine   21 mg Transdermal Daily   OLANZapine   5 mg Oral QHS   OXcarbazepine   150 mg Oral QHS   pantoprazole   40 mg Oral BID   pregabalin   50 mg Oral BID   sertraline   25 mg Oral Daily   Vitamin D  (Ergocalciferol )  50,000 Units Oral Once per day on Monday Thursday   Continuous Infusions:  meropenem  (MERREM ) IV 1 g (11/08/24 0946)     Anti-infectives (From admission, onward)    Start     Dose/Rate  Route Frequency Ordered Stop   11/06/24 1000  meropenem  (MERREM ) 1 g in sodium chloride  0.9 % 100 mL IVPB        1 g 200 mL/hr over 30 Minutes Intravenous Every 12 hours 11/06/24 0750     11/06/24 0600  cefTRIAXone  (ROCEPHIN ) 2 g in sodium chloride  0.9 % 100 mL IVPB  Status:  Discontinued        2 g 200 mL/hr over 30 Minutes Intravenous Every 24 hours 11/06/24 0513 11/06/24 0749   11/06/24 0215  piperacillin -tazobactam (ZOSYN ) IVPB 3.375 g        3.375 g 100 mL/hr over 30 Minutes Intravenous  Once 11/06/24 0210 11/06/24 0253              Family Communication/Anticipated D/C date and plan/Code Status   DVT prophylaxis:  enoxaparin  (LOVENOX ) injection 30 mg Start: 11/07/24 0800     Code Status: Full Code  Family Communication: None Disposition Plan: Plan to discharge home   Status is: Inpatient Remains inpatient appropriate because: Encephalopathy, UTI, hypoglycemia       Subjective:   Interval events noted.  He has no complaints.  Mental status is better.  He ate breakfast this morning.  Sitter at the bedside.  Objective:    Vitals:   11/07/24 1612 11/07/24 1934 11/08/24 0402 11/08/24 1037  BP: (!) 146/69 128/60 (!) 116/45 130/64  Pulse: (!) 102 98 (!) 110 99  Resp: 18  16 14   Temp: 98.3 F (36.8 C) 98.7 F (37.1 C) 98.3 F (36.8 C) 98.5 F (36.9 C)  TempSrc: Oral Oral Axillary Oral  SpO2: 98% 99% 98% 100%  Weight:      Height:       No data found.   Intake/Output Summary (Last 24 hours) at 11/08/2024 1114 Last data filed at 11/08/2024 1009 Gross per 24 hour  Intake 600 ml  Output 1100 ml  Net -500 ml   Filed Weights   11/06/24 0501  Weight: 43.6 kg    Exam:  GEN: NAD SKIN: Warm and dry EYES: No pallor or icterus ENT: MMM CV: RRR PULM: CTA B ABD: soft, ND, NT, +BS CNS: AAO x person, place and time but disoriented to situation, non focal EXT: Bilateral AKA    Data Reviewed:   I have personally reviewed following labs and imaging studies:  Labs: Labs show the following:   Basic Metabolic Panel: Recent Labs  Lab 11/05/24 2339 11/07/24 0434  NA 133* 140  K 4.4 4.2  CL 96* 105  CO2 23 22  GLUCOSE 208* 60*  BUN 22* 15  CREATININE 1.70* 1.21  CALCIUM 10.0 9.3  MG  --  2.1   GFR Estimated Creatinine Clearance: 40 mL/min (by C-G formula based on SCr of 1.21 mg/dL). Liver Function Tests: Recent Labs  Lab 11/05/24 2339  AST 26  ALT 23  ALKPHOS 127*  BILITOT 0.4  PROT 7.5  ALBUMIN 4.0   Recent Labs  Lab 11/05/24 2339  LIPASE <10*   Recent Labs  Lab 11/05/24 2339  AMMONIA 18   Coagulation profile No results for input(s): INR,  PROTIME in the last 168 hours.  CBC: Recent Labs  Lab 11/05/24 2339 11/07/24 0434  WBC 5.4 6.4  NEUTROABS 2.8  --   HGB 11.9* 13.2  HCT 36.5* 39.9  MCV 88.4 88.3  PLT 354 359   Cardiac Enzymes: No results for input(s): CKTOTAL, CKMB, CKMBINDEX, TROPONINI in the last 168 hours. BNP (last 3 results) No results  for input(s): PROBNP in the last 8760 hours. CBG: Recent Labs  Lab 11/07/24 0946 11/07/24 1152 11/07/24 1705 11/07/24 1935 11/08/24 0800  GLUCAP 217* 120* 338* 295* 180*   D-Dimer: No results for input(s): DDIMER in the last 72 hours. Hgb A1c: No results for input(s): HGBA1C in the last 72 hours. Lipid Profile: No results for input(s): CHOL, HDL, LDLCALC, TRIG, CHOLHDL, LDLDIRECT in the last 72 hours. Thyroid function studies: No results for input(s): TSH, T4TOTAL, T3FREE, THYROIDAB in the last 72 hours.  Invalid input(s): FREET3 Anemia work up: No results for input(s): VITAMINB12, FOLATE, FERRITIN, TIBC, IRON, RETICCTPCT in the last 72 hours. Sepsis Labs: Recent Labs  Lab 11/05/24 2339 11/07/24 0434  WBC 5.4 6.4    Microbiology Recent Results (from the past 240 hours)  Resp panel by RT-PCR (RSV, Flu A&B, Covid) Anterior Nasal Swab     Status: None   Collection Time: 11/05/24 11:39 PM   Specimen: Anterior Nasal Swab  Result Value Ref Range Status   SARS Coronavirus 2 by RT PCR NEGATIVE NEGATIVE Final    Comment: (NOTE) SARS-CoV-2 target nucleic acids are NOT DETECTED.  The SARS-CoV-2 RNA is generally detectable in upper respiratory specimens during the acute phase of infection. The lowest concentration of SARS-CoV-2 viral copies this assay can detect is 138 copies/mL. A negative result does not preclude SARS-Cov-2 infection and should not be used as the sole basis for treatment or other patient management decisions. A negative result may occur with  improper specimen collection/handling, submission  of specimen other than nasopharyngeal swab, presence of viral mutation(s) within the areas targeted by this assay, and inadequate number of viral copies(<138 copies/mL). A negative result must be combined with clinical observations, patient history, and epidemiological information. The expected result is Negative.  Fact Sheet for Patients:  bloggercourse.com  Fact Sheet for Healthcare Providers:  seriousbroker.it  This test is no t yet approved or cleared by the [place]  FDA and  has been authorized for detection and/or diagnosis of SARS-CoV-2 by FDA under an Emergency Use Authorization (EUA). This EUA will remain  in effect (meaning this test can be used) for the duration of the COVID-19 declaration under Section 564(b)(1) of the Act, 21 U.S.C.section 360bbb-3(b)(1), unless the authorization is terminated  or revoked sooner.       Influenza A by PCR NEGATIVE NEGATIVE Final   Influenza B by PCR NEGATIVE NEGATIVE Final    Comment: (NOTE) The Xpert Xpress SARS-CoV-2/FLU/RSV plus assay is intended as an aid in the diagnosis of influenza from Nasopharyngeal swab specimens and should not be used as a sole basis for treatment. Nasal washings and aspirates are unacceptable for Xpert Xpress SARS-CoV-2/FLU/RSV testing.  Fact Sheet for Patients: bloggercourse.com  Fact Sheet for Healthcare Providers: seriousbroker.it  This test is not yet approved or cleared by the [place]  FDA and has been authorized for detection and/or diagnosis of SARS-CoV-2 by FDA under an Emergency Use Authorization (EUA). This EUA will remain in effect (meaning this test can be used) for the duration of the COVID-19 declaration under Section 564(b)(1) of the Act, 21 U.S.C. section 360bbb-3(b)(1), unless the authorization is terminated or revoked.     Resp Syncytial Virus by PCR NEGATIVE NEGATIVE  Final    Comment: (NOTE) Fact Sheet for Patients: bloggercourse.com  Fact Sheet for Healthcare Providers: seriousbroker.it  This test is not yet approved or cleared by the [place]  FDA and has been authorized for detection and/or diagnosis of SARS-CoV-2 by FDA under an  Emergency Use Authorization (EUA). This EUA will remain in effect (meaning this test can be used) for the duration of the COVID-19 declaration under Section 564(b)(1) of the Act, 21 U.S.C. section 360bbb-3(b)(1), unless the authorization is terminated or revoked.  Performed at [facility], [address] [place], [place] [address]   Culture, Urine (Do not remove urinary catheter, catheter placed by urology or difficult to place)     Status: Abnormal   Collection Time: 11/06/24  1:44 AM   Specimen: Urine, Catheterized  Result Value Ref Range Status   Specimen Description   Final    URINE, CATHETERIZED Performed at [facility], [address] [place], [place] [address]    Special Requests   Final    NONE Performed at [facility], [address] [place], [place] [address]    Culture MULTIPLE SPECIES PRESENT, SUGGEST RECOLLECTION (A)  Final   Report Status 11/08/2024 FINAL  Final    Procedures and diagnostic studies:  No results found.              LOS: 2 days   Sherly Brodbeck  [facility]   Pager on [email or web address]. If 7PM-7AM, please contact night-coverage at [email or web address]     11/08/2024, 11:14 AM

## 2024-11-09 DIAGNOSIS — G9341 Metabolic encephalopathy: Secondary | ICD-10-CM | POA: Diagnosis not present

## 2024-11-09 LAB — GLUCOSE, CAPILLARY
Glucose-Capillary: 203 mg/dL — ABNORMAL HIGH (ref 70–99)
Glucose-Capillary: 185 mg/dL — ABNORMAL HIGH (ref 70–99)
Glucose-Capillary: 205 mg/dL — ABNORMAL HIGH (ref 70–99)
Glucose-Capillary: 295 mg/dL — ABNORMAL HIGH (ref 70–99)

## 2024-11-09 MED ORDER — INSULIN GLARGINE 100 UNIT/ML ~~LOC~~ SOLN
6.0000 [IU] | Freq: Every day | SUBCUTANEOUS | Status: DC
  Administered 2024-11-09: 6 [IU] via SUBCUTANEOUS
  Filled 2024-11-09 (×2): qty 0.06

## 2024-11-09 NOTE — Evaluation (Signed)
 Occupational Therapy Evaluation Patient Details Name: Timothy Clayton MRN: [id number] DOB: 03-29-1966 Today's Date: 11/09/2024   History of Present Illness   Timothy Clayton is a 58 y.o. African-American male with medical history significant for type 2 diabetes mellitus, pancreatitis, CKD, and bilateral AKA with suprapubic catheter, who presented to the emergency room with acute onset of failure to thrive. MD dx include acute metabolic encephalopathy, acute lower UTI, essential HTN, GERD without esophagitis, peripheral vascular disease, and seizure disorder.     Clinical Impressions Upon entering the room, pt supine in bed and sleeping soundly. Pt is very difficult to wake up and reports he is just very tired. Pt reporting he lives at home alone and has a PCA that comes 5x/wk to assist with IADLs. He endorse being Ind with ADLs from wheelchair level and he transfers himself at baseline. He does have prosthetics but does not wear at baseline. Pt demonstrates bed mobility and long sitting with B UEs only as well as, lateral leans for LB self care without assistance. Pt declines transfer. Staff as well as PT note (from 11/20) report pt demonstrated transferring himself without assistance. Pt reports being at functional baseline and wanting to return home. Pt with no skilled acute OT needs at this time. OT to sign off.       Functional Status Assessment   Patient has not had a recent decline in their functional status       Precautions/Restrictions   Precautions Precautions: Fall     Mobility Bed Mobility Overal bed mobility: Modified Independent             General bed mobility comments: Use of bed features for supine<>sit.    Transfers                   General transfer comment: Pt performing bed mobility without assistance as well as long sitting but declines chair transfer this session. Has been transferring with staff and PT(yesterday) on his own)      Balance  Overall balance assessment: No apparent balance deficits (not formally assessed)                                         ADL either performed or assessed with clinical judgement   ADL Overall ADL's : Modified independent;At baseline                                             Vision Patient Visual Report: No change from baseline              Pertinent Vitals/Pain Pain Assessment Pain Assessment: No/denies pain     Extremity/Trunk Assessment Upper Extremity Assessment Upper Extremity Assessment: Overall WFL for tasks assessed   Lower Extremity Assessment Lower Extremity Assessment: Overall WFL for tasks assessed       Communication Communication Communication: No apparent difficulties   Cognition Arousal: Alert, Lethargic Behavior During Therapy: Flat affect Cognition: No apparent impairments                               Following commands: Intact       Cueing  General Comments   Cueing Techniques: Verbal cues  Home Living Family/patient expects to be discharged to:: Private residence Living Arrangements: Alone Available Help at Discharge: Family;Personal care attendant Type of Home: Apartment Home Access: Level entry     Home Layout: One level     Bathroom Shower/Tub: Chief Strategy Officer: Standard     Home Equipment: Wheelchair - manual   Additional Comments: per chart review pt has B prostheses but does not use them      Prior Functioning/Environment Prior Level of Function : Independent/Modified Independent             Mobility Comments: IND with transfers and use of manual wc ADLs Comments: IND from wheelchair level and reports having PCA 5x/wk to assist with IADLs     AM-PAC OT 6 Clicks Daily Activity     Outcome Measure Help from another person eating meals?: None Help from another person taking care of personal grooming?: None Help from another  person toileting, which includes using toliet, bedpan, or urinal?: None Help from another person bathing (including washing, rinsing, drying)?: None Help from another person to put on and taking off regular upper body clothing?: None Help from another person to put on and taking off regular lower body clothing?: None 6 Click Score: 24   End of Session    Activity Tolerance: Patient limited by fatigue Patient left: in bed;with call bell/phone within reach;with bed alarm set                   Time: 8843-8787 OT Time Calculation (min): 16 min Charges:  OT General Charges $OT Visit: 1 Visit OT Evaluation $OT Eval Low Complexity: 1 Low  Izetta Claude, MS, OTR/L , CBIS ascom [phone]  11/09/24, 4:14 PM

## 2024-11-09 NOTE — Plan of Care (Signed)

## 2024-11-09 NOTE — Progress Notes (Addendum)
 Progress Note    Timothy Clayton  FMW:[id number] DOB: 1966-05-29  DOA: 11/05/2024 PCP: Pcp, No      Brief Narrative:    Medical records reviewed and are as summarized below:  Timothy Clayton is a 58 y.o. male with medical history significant for type II DM, pancreatitis, CKD, bilateral AKA, suprapubic catheter in place, who presented to the hospital because of refusal to eat and take medications.  Reportedly, he had not been eating for 4 days and had only been using marijuana and opioids.  He was confused and agitated.  Urinalysis was concerning for acute UTI.  His son requested that patient be placed at an assisted living facility.      Assessment/Plan:   Principal Problem:   Acute metabolic encephalopathy Active Problems:   Acute lower UTI   Essential hypertension   GERD without esophagitis   Dyslipidemia   Seizure disorder (HCC)   Peripheral vascular disease   CKD stage 3a, GFR 45-59 ml/min (HCC)   Protein-calorie malnutrition, severe   Failure to thrive in adult   Body mass index is 14.62 kg/m.  (Underweight)   Acute complicated UTI, underlying neurogenic bladder with suprapubic catheter in place: Urine culture showed multiple mixed species.  Plan to complete 5-day course of meropenem  on 11/09/2024 because of history of multidrug-resistant E. coli.    Insulin -dependent diabetes mellitus, recent hypoglycemia: Hypoglycemia has improved.   Resume home Lantus  6 units nightly.  Continue NovoLog  as needed per sliding scale. Hemoglobin A1c 9.1 on 10/17/2024.   Acute metabolic encephalopathy: Improved.  Mental status is much better. History of delirium and agitation.   Poor oral intake, general weakness, severe protein calorie malnutrition, failure to thrive, s/p bilateral AKA: PT evaluation.  Encourage adequate oral intake.   Opioid use disorder: Reportedly, patient was on oxycodone  at home but this was discontinued by his PCP about 2 weeks prior to  admission.   General weakness: Case manager is working on ALF placement.   Comorbidities include CKD stage IV (saw his nephrologist on 10/17/2024), seizure disorder on Trileptal , PVD, CAD, stroke, dyslipidemia, hypertension, substance use disorder (alcohol, tobacco, marijuana), chronic pancreatitis    Plan discussed with Bernardino, son, over the phone   Diet Order             Diet Heart Room service appropriate? Yes; Fluid consistency: Thin  Diet effective now                                  Consultants: None  Procedures: None    Medications:    busPIRone   5 mg Oral QHS   Chlorhexidine  Gluconate Cloth  6 each Topical Daily   cilostazol   100 mg Oral BID   enoxaparin  (LOVENOX ) injection  30 mg Subcutaneous Q24H   feeding supplement  237 mL Oral BID BM   insulin  aspart  0-6 Units Subcutaneous TID WC   insulin  glargine  6 Units Subcutaneous QHS   lipase/protease/amylase  12,000 Units Oral TID WC   mirtazapine   15 mg Oral QHS   nicotine   21 mg Transdermal Daily   OLANZapine   5 mg Oral QHS   OXcarbazepine   150 mg Oral QHS   pantoprazole   40 mg Oral BID   pregabalin   50 mg Oral BID   sertraline   25 mg Oral Daily   Vitamin D  (Ergocalciferol )  50,000 Units Oral Once per day on Monday Thursday   Continuous  Infusions:  meropenem  (MERREM ) IV 1 g (11/09/24 0923)     Anti-infectives (From admission, onward)    Start     Dose/Rate Route Frequency Ordered Stop   11/06/24 1000  meropenem  (MERREM ) 1 g in sodium chloride  0.9 % 100 mL IVPB        1 g 200 mL/hr over 30 Minutes Intravenous Every 12 hours 11/06/24 0750     11/06/24 0600  cefTRIAXone  (ROCEPHIN ) 2 g in sodium chloride  0.9 % 100 mL IVPB  Status:  Discontinued        2 g 200 mL/hr over 30 Minutes Intravenous Every 24 hours 11/06/24 0513 11/06/24 0749   11/06/24 0215  piperacillin -tazobactam (ZOSYN ) IVPB 3.375 g        3.375 g 100 mL/hr over 30 Minutes Intravenous  Once 11/06/24 0210 11/06/24 0253               Family Communication/Anticipated D/C date and plan/Code Status   DVT prophylaxis: enoxaparin  (LOVENOX ) injection 30 mg Start: 11/07/24 0800     Code Status: Full Code  Family Communication: Plan discussed with Bernardino, son, over the phone Disposition Plan: Plan to discharge home   Status is: Inpatient Remains inpatient appropriate because: Encephalopathy, UTI, hypoglycemia       Subjective:   No acute issues reported.  He feels much better today.  No complaints.  Objective:    Vitals:   11/08/24 1619 11/08/24 2149 11/09/24 0729 11/09/24 1548  BP: (!) 156/75 (!) 152/82 137/74 110/64  Pulse: 98 98 90   Resp: 18 16 14 18   Temp: 98.4 F (36.9 C) 98.9 F (37.2 C) 98.1 F (36.7 C) 98.2 F (36.8 C)  TempSrc: Oral Oral  Oral  SpO2: 100% 100% 98% 100%  Weight:      Height:       No data found.   Intake/Output Summary (Last 24 hours) at 11/09/2024 1643 Last data filed at 11/09/2024 1300 Gross per 24 hour  Intake 1200 ml  Output 1325 ml  Net -125 ml   Filed Weights   11/06/24 0501  Weight: 43.6 kg    Exam:  GEN: NAD SKIN: Warm and dry EYES: No pallor or icterus ENT: MMM CV: RRR PULM: CTA B ABD: soft, ND, NT, +BS CNS: AAO x 3, non focal EXT: No edema or tenderness.  Bilateral AKA     Data Reviewed:   I have personally reviewed following labs and imaging studies:  Labs: Labs show the following:   Basic Metabolic Panel: Recent Labs  Lab 11/05/24 2339 11/07/24 0434  NA 133* 140  K 4.4 4.2  CL 96* 105  CO2 23 22  GLUCOSE 208* 60*  BUN 22* 15  CREATININE 1.70* 1.21  CALCIUM 10.0 9.3  MG  --  2.1   GFR Estimated Creatinine Clearance: 40 mL/min (by C-G formula based on SCr of 1.21 mg/dL). Liver Function Tests: Recent Labs  Lab 11/05/24 2339  AST 26  ALT 23  ALKPHOS 127*  BILITOT 0.4  PROT 7.5  ALBUMIN 4.0   Recent Labs  Lab 11/05/24 2339  LIPASE <10*   Recent Labs  Lab 11/05/24 2339  AMMONIA 18    Coagulation profile No results for input(s): INR, PROTIME in the last 168 hours.  CBC: Recent Labs  Lab 11/05/24 2339 11/07/24 0434  WBC 5.4 6.4  NEUTROABS 2.8  --   HGB 11.9* 13.2  HCT 36.5* 39.9  MCV 88.4 88.3  PLT 354 359   Cardiac Enzymes:  No results for input(s): CKTOTAL, CKMB, CKMBINDEX, TROPONINI in the last 168 hours. BNP (last 3 results) No results for input(s): PROBNP in the last 8760 hours. CBG: Recent Labs  Lab 11/08/24 1600 11/08/24 2153 11/09/24 0729 11/09/24 1205 11/09/24 1550  GLUCAP 221* 196* 203* 185* 205*   D-Dimer: No results for input(s): DDIMER in the last 72 hours. Hgb A1c: No results for input(s): HGBA1C in the last 72 hours. Lipid Profile: No results for input(s): CHOL, HDL, LDLCALC, TRIG, CHOLHDL, LDLDIRECT in the last 72 hours. Thyroid function studies: No results for input(s): TSH, T4TOTAL, T3FREE, THYROIDAB in the last 72 hours.  Invalid input(s): FREET3 Anemia work up: No results for input(s): VITAMINB12, FOLATE, FERRITIN, TIBC, IRON, RETICCTPCT in the last 72 hours. Sepsis Labs: Recent Labs  Lab 11/05/24 2339 11/07/24 0434  WBC 5.4 6.4    Microbiology Recent Results (from the past 240 hours)  Resp panel by RT-PCR (RSV, Flu A&B, Covid) Anterior Nasal Swab     Status: None   Collection Time: 11/05/24 11:39 PM   Specimen: Anterior Nasal Swab  Result Value Ref Range Status   SARS Coronavirus 2 by RT PCR NEGATIVE NEGATIVE Final    Comment: (NOTE) SARS-CoV-2 target nucleic acids are NOT DETECTED.  The SARS-CoV-2 RNA is generally detectable in upper respiratory specimens during the acute phase of infection. The lowest concentration of SARS-CoV-2 viral copies this assay can detect is 138 copies/mL. A negative result does not preclude SARS-Cov-2 infection and should not be used as the sole basis for treatment or other patient management decisions. A negative result may occur  with  improper specimen collection/handling, submission of specimen other than nasopharyngeal swab, presence of viral mutation(s) within the areas targeted by this assay, and inadequate number of viral copies(<138 copies/mL). A negative result must be combined with clinical observations, patient history, and epidemiological information. The expected result is Negative.  Fact Sheet for Patients:  bloggercourse.com  Fact Sheet for Healthcare Providers:  seriousbroker.it  This test is no t yet approved or cleared by the [place]  FDA and  has been authorized for detection and/or diagnosis of SARS-CoV-2 by FDA under an Emergency Use Authorization (EUA). This EUA will remain  in effect (meaning this test can be used) for the duration of the COVID-19 declaration under Section 564(b)(1) of the Act, 21 U.S.C.section 360bbb-3(b)(1), unless the authorization is terminated  or revoked sooner.       Influenza A by PCR NEGATIVE NEGATIVE Final   Influenza B by PCR NEGATIVE NEGATIVE Final    Comment: (NOTE) The Xpert Xpress SARS-CoV-2/FLU/RSV plus assay is intended as an aid in the diagnosis of influenza from Nasopharyngeal swab specimens and should not be used as a sole basis for treatment. Nasal washings and aspirates are unacceptable for Xpert Xpress SARS-CoV-2/FLU/RSV testing.  Fact Sheet for Patients: bloggercourse.com  Fact Sheet for Healthcare Providers: seriousbroker.it  This test is not yet approved or cleared by the [place]  FDA and has been authorized for detection and/or diagnosis of SARS-CoV-2 by FDA under an Emergency Use Authorization (EUA). This EUA will remain in effect (meaning this test can be used) for the duration of the COVID-19 declaration under Section 564(b)(1) of the Act, 21 U.S.C. section 360bbb-3(b)(1), unless the authorization is terminated  or revoked.     Resp Syncytial Virus by PCR NEGATIVE NEGATIVE Final    Comment: (NOTE) Fact Sheet for Patients: bloggercourse.com  Fact Sheet for Healthcare Providers: seriousbroker.it  This test is not yet approved or cleared  by the [place]  FDA and has been authorized for detection and/or diagnosis of SARS-CoV-2 by FDA under an Emergency Use Authorization (EUA). This EUA will remain in effect (meaning this test can be used) for the duration of the COVID-19 declaration under Section 564(b)(1) of the Act, 21 U.S.C. section 360bbb-3(b)(1), unless the authorization is terminated or revoked.  Performed at [facility], [address] [place], [place] [address]   Culture, Urine (Do not remove urinary catheter, catheter placed by urology or difficult to place)     Status: Abnormal   Collection Time: 11/06/24  1:44 AM   Specimen: Urine, Catheterized  Result Value Ref Range Status   Specimen Description   Final    URINE, CATHETERIZED Performed at [facility], [address], [place], [place] [address]    Special Requests   Final    NONE Performed at [facility], [address] [place], [place] [address]    Culture MULTIPLE SPECIES PRESENT, SUGGEST RECOLLECTION (A)  Final   Report Status 11/08/2024 FINAL  Final    Procedures and diagnostic studies:  No results found.              LOS: 3 days   Rabiah Goeser  [facility]   Pager on [email or web address]. If 7PM-7AM, please contact night-coverage at [email or web address]     11/09/2024, 4:43 PM

## 2024-11-09 NOTE — TOC Progression Note (Addendum)
 Transition of Care [facility]) - Progression Note    Patient Details  Name: Timothy Clayton MRN: [id number] Date of Birth: 05/12/1966  [facility] CM/SW Contact  Racheal LITTIE Schimke, RN Phone Number: 11/09/2024, 1:19 PM  Clinical Narrative: Attempted to call Son to discuss discharge for tomorrow, 11/09/24, no answer left voice message to return call.   Son called back says he has not secured ALF, request that PT call him to discuss evaluation for SNF criteria. PT called and discussed information with Son. Son now voices understanding of need to secure placement, ALF. Son given phone number for DSS for guidance.   Expected Discharge Plan:  (TBD) Barriers to Discharge: Continued Medical Work up               Expected Discharge Plan and Services     Post Acute Care Choice:  (TBD) Living arrangements for the past 2 months: Single Family Home                                       Social Drivers of Health (SDOH) Interventions SDOH Screenings   Food Insecurity: No Food Insecurity (10/24/2024)   Received from [facility]  Transportation Needs: No Transportation Needs (10/24/2024)   Received from [facility]  Utilities: Low Risk (01/19/2024)   Received [facility]  Financial Resource Strain: Medium Risk (10/24/2024)   Received from [facility]  Physical Activity: Sufficiently Active (01/19/2024)   Received from [facility]  Social Connections: Moderately Integrated (01/19/2024)   Received from [facility]  Stress: No Stress Concern Present (01/19/2024)   Received from [facility]  Tobacco Use: High Risk (11/05/2024)  Health Literacy: Low Risk (04/14/2024)   Received from [facility]    Readmission Risk Interventions     No data to display

## 2024-11-09 NOTE — Plan of Care (Signed)

## 2024-11-10 DIAGNOSIS — E109 Type 1 diabetes mellitus without complications: Secondary | ICD-10-CM | POA: Diagnosis present

## 2024-11-10 DIAGNOSIS — G9341 Metabolic encephalopathy: Secondary | ICD-10-CM | POA: Diagnosis not present

## 2024-11-10 LAB — GLUCOSE, CAPILLARY
Glucose-Capillary: 144 mg/dL — ABNORMAL HIGH (ref 70–99)
Glucose-Capillary: 173 mg/dL — ABNORMAL HIGH (ref 70–99)

## 2024-11-10 NOTE — Plan of Care (Signed)

## 2024-11-10 NOTE — Discharge Summary (Addendum)
 Physician Discharge Summary   Patient: Timothy Clayton MRN: [id number] DOB: 28-Jan-1966  Admit date:     11/05/2024  Discharge date: 11/10/24  Discharge Physician: AIDA CHO   PCP: Pcp, No   Recommendations at discharge:   Follow-up with PCP in 1 to 2 weeks  Discharge Diagnoses: Principal Problem:   Acute metabolic encephalopathy Active Problems:   Acute lower UTI   Essential hypertension   GERD without esophagitis   Dyslipidemia   Seizure disorder (HCC)   Peripheral vascular disease   CKD stage 3a, GFR 45-59 ml/min (HCC)   Protein-calorie malnutrition, severe   Failure to thrive in adult   Insulin  dependent type 1 diabetes mellitus (HCC)  Resolved Problems:   * No resolved hospital problems. *  Hospital Course:  Timothy Clayton is a 58 y.o. male with medical history significant for type II DM, pancreatitis, CKD, bilateral AKA, suprapubic catheter in place, who presented to the hospital because of refusal to eat and take medications.  Reportedly, he had not been eating for 4 days and had only been using marijuana and opioids.  He was confused and agitated.  Urinalysis was concerning for acute UTI.  His son requested that patient be placed at an assisted living facility.    Assessment and Plan:  Acute complicated UTI, underlying neurogenic bladder with suprapubic catheter in place: Urine culture showed multiple mixed species. He completed 5-day course of meropenem . History of multidrug-resistant E. coli.     Insulin -dependent diabetes mellitus, recent hypoglycemia: Hypoglycemia has improved.   Lantus  6 units nightly at discharge. Hemoglobin A1c 9.1 on 10/17/2024.     Acute metabolic encephalopathy: Improved.  Mental status is much better. History of delirium and agitation.     Poor oral intake, general weakness, severe protein calorie malnutrition, failure to thrive, s/p bilateral AKA:  Encourage adequate oral intake. His son is making arrangements for him to go  to an assisted living facility.    Opioid use disorder: Reportedly, patient was on oxycodone  at home but this was discontinued by his PCP about 2 weeks prior to admission.     Comorbidities include CKD stage IV (saw his nephrologist on 10/17/2024), ? ?seizure disorder (it appears he is no longer on Trileptal ), PVD, CAD, stroke on aspirin, dyslipidemia, hypertension, substance use disorder (alcohol, tobacco, marijuana), chronic pancreatitis on Creon      Discharge plan discussed with Timothy Clayton, son, over the phone.      Consultants: None Procedures performed: None Disposition: Home Diet recommendation:  Discharge Diet Orders (From admission, onward)     Start     Ordered   11/10/24 0000  Diet - low sodium heart healthy        11/10/24 1120   11/10/24 0000  Diet Carb Modified        11/10/24 1120           Cardiac and Carb modified diet DISCHARGE MEDICATION: Allergies as of 11/10/2024       Reactions   Aspirin    Gross hematuria through suprapubic catheter   Gabapentin Diarrhea   Glipizide Diarrhea   Metformin Diarrhea   Angiotensin Receptor Blockers Other (See Comments)   Hyperkalemia   Melatonin Anxiety   Delirium after melatonin        Medication List     STOP taking these medications    amoxicillin -clavulanate 875-125 MG tablet Commonly known as: AUGMENTIN    cilostazol  100 MG tablet Commonly known as: PLETAL    mirtazapine  15 MG tablet Commonly known as: REMERON   omeprazole 20 MG capsule Commonly known as: PRILOSEC   OXcarbazepine  150 MG tablet Commonly known as: TRILEPTAL    oxyCODONE -acetaminophen  5-325 MG tablet Commonly known as: Percocet   solifenacin 5 MG tablet Commonly known as: VESICARE   Vitamin D  (Ergocalciferol ) 1.25 MG (50000 UNIT) Caps capsule Commonly known as: DRISDOL        TAKE these medications    acetaminophen  500 MG tablet Commonly known as: TYLENOL  Take 500 mg by mouth every 6 (six) hours as needed for moderate  pain.   amLODipine 10 MG tablet Commonly known as: NORVASC Take 10 mg by mouth daily.   aspirin 81 MG chewable tablet Chew 81 mg by mouth daily. chew and swallow   atorvastatin 40 MG tablet Commonly known as: LIPITOR Take 40 mg by mouth daily.   busPIRone  5 MG tablet Commonly known as: BUSPAR  Take 5 mg by mouth at bedtime.   carvedilol 25 MG tablet Commonly known as: COREG Take 25 mg by mouth 2 (two) times daily with a meal.   cetirizine 10 MG tablet Commonly known as: ZYRTEC Take 10 mg by mouth daily as needed.   Creon  24000-76000 units Cpep Generic drug: Pancrelipase  (Lip-Prot-Amyl) Take 4 capsules by mouth 3 (three) times daily. What changed: Another medication with the same name was removed. Continue taking this medication, and follow the directions you see here.   Lantus  SoloStar 100 UNIT/ML Solostar Pen Generic drug: insulin  glargine Inject 6 Units into the skin at bedtime. What changed: Another medication with the same name was removed. Continue taking this medication, and follow the directions you see here.   melatonin 3 MG Tabs tablet Take 9 mg by mouth at bedtime as needed.   OLANZapine  5 MG tablet Commonly known as: ZYPREXA  Take 5 mg by mouth at bedtime.   ondansetron  4 MG disintegrating tablet Commonly known as: ZOFRAN -ODT Take 4 mg by mouth every 8 (eight) hours as needed.   pantoprazole  40 MG tablet Commonly known as: PROTONIX  Take 40 mg by mouth daily.   pregabalin  50 MG capsule Commonly known as: LYRICA  Take 50 mg by mouth 2 (two) times daily.   sertraline  25 MG tablet Commonly known as: ZOLOFT  Take 25 mg by mouth daily.   sodium bicarbonate 650 MG tablet Take 650 mg by mouth 3 (three) times daily.   Tab-A-Vite Tabs Take 1 tablet by mouth daily.   traZODone  50 MG tablet Commonly known as: DESYREL  Take 50 mg by mouth at bedtime as needed. for sleep   Vitamin D -1000 Max St 25 MCG (1000 UT) tablet Generic drug: Cholecalciferol Take 25  mcg by mouth daily.        Discharge Exam: Filed Weights   11/06/24 0501  Weight: 43.6 kg   GEN: NAD SKIN: Warm and dry EYES: No pallor or icterus ENT: MMM CV: RRR PULM: CTA B ABD: soft, ND, NT, +BS CNS: AAO x 3, non focal EXT: No edema or tenderness.  Bilateral AKA   Condition at discharge: good  The results of significant diagnostics from this hospitalization (including imaging, microbiology, ancillary and laboratory) are listed below for reference.   Imaging Studies: DG Chest 2 View Result Date: 11/06/2024 EXAM: 2 VIEW(S) XRAY OF THE CHEST 11/06/2024 02:00:48 AM COMPARISON: None available. CLINICAL HISTORY: shortness of breath FINDINGS: LUNGS AND PLEURA: Mild elevation left hemidiaphragm. No focal pulmonary opacity. No pleural effusion. No pneumothorax. HEART AND MEDIASTINUM: No acute abnormality of the cardiac and mediastinal silhouettes. BONES AND SOFT TISSUES: No acute osseous abnormality. IMPRESSION: 1.  Mild elevation of the left hemidiaphragm. Electronically signed by: Dorethia Molt MD 11/06/2024 02:22 AM EST RP Workstation: [id number]   CT HEAD WO CONTRAST ( ) Result Date: 11/06/2024 EXAM: CT HEAD WITHOUT CONTRAST 11/06/2024 01:57:06 AM TECHNIQUE: CT of the head was performed without the administration of intravenous contrast. Automated exposure control, iterative reconstruction, and/or weight based adjustment of the mA/kV was utilized to reduce the radiation dose to as low as reasonably achievable. COMPARISON: None available. 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 No acute hemorrhage. No evidence of acute infarct. No hydrocephalus. No extra-axial collection. No mass effect or midline shift. ORBITS: No acute abnormality. SINUSES: No acute abnormality. SOFT TISSUES AND SKULL: No acute soft tissue abnormality. No skull fracture. IMPRESSION: 1. No acute intracranial abnormality. 2.  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 Electronically signed by: Dorethia Molt MD 11/06/2024 02:22 AM EST RP Workstation: [id number]    Microbiology: Results for orders placed or performed during the hospital encounter of 11/05/24  Resp panel by RT-PCR (RSV, Flu A&B, Covid) Anterior Nasal Swab     Status: None   Collection Time: 11/05/24 11:39 PM   Specimen: Anterior Nasal Swab  Result Value Ref Range Status   SARS Coronavirus 2 by RT PCR NEGATIVE NEGATIVE Final    Comment: (NOTE) SARS-CoV-2 target nucleic acids are NOT DETECTED.  The SARS-CoV-2 RNA is generally detectable in upper respiratory specimens during the acute phase of infection. The lowest concentration of SARS-CoV-2 viral copies this assay can detect is 138 copies/mL. A negative result does not preclude SARS-Cov-2 infection and should not be used as the sole basis for treatment or other patient management decisions. A negative result may occur with  improper specimen collection/handling, submission of specimen other than nasopharyngeal swab, presence of viral mutation(s) within the areas targeted by this assay, and inadequate number of viral copies(<138 copies/mL). A negative result must be combined with clinical observations, patient history, and epidemiological information. The expected result is Negative.  Fact Sheet for Patients:  bloggercourse.com  Fact Sheet for Healthcare Providers:  seriousbroker.it  This test is no t yet approved or cleared by the [place]  FDA and  has been authorized for detection and/or diagnosis of SARS-CoV-2 by FDA under an Emergency Use Authorization (EUA). This EUA will remain  in effect (meaning this test can be used) for the duration of the COVID-19 declaration under Section 564(b)(1) of the Act, 21 U.S.C.section 360bbb-3(b)(1), unless the authorization is terminated  or revoked sooner.        Influenza A by PCR NEGATIVE NEGATIVE Final   Influenza B by PCR NEGATIVE NEGATIVE Final    Comment: (NOTE) The Xpert Xpress SARS-CoV-2/FLU/RSV plus assay is intended as an aid in the diagnosis of influenza from Nasopharyngeal swab specimens and should not be used as a sole basis for treatment. Nasal washings and aspirates are unacceptable for Xpert Xpress SARS-CoV-2/FLU/RSV testing.  Fact Sheet for Patients: bloggercourse.com  Fact Sheet for Healthcare Providers: seriousbroker.it  This test is not yet approved or cleared by the [place]  FDA and has been authorized for detection and/or diagnosis of SARS-CoV-2 by FDA under an Emergency Use Authorization (EUA). This EUA will remain in effect (meaning this test can be used) for the duration of the COVID-19 declaration under Section 564(b)(1) of the Act, 21 U.S.C. section 360bbb-3(b)(1), unless the authorization is terminated or revoked.     Resp Syncytial Virus by PCR NEGATIVE NEGATIVE Final  Comment: (NOTE) Fact Sheet for Patients: bloggercourse.com  Fact Sheet for Healthcare Providers: seriousbroker.it  This test is not yet approved or cleared by the [place]  FDA and has been authorized for detection and/or diagnosis of SARS-CoV-2 by FDA under an Emergency Use Authorization (EUA). This EUA will remain in effect (meaning this test can be used) for the duration of the COVID-19 declaration under Section 564(b)(1) of the Act, 21 U.S.C. section 360bbb-3(b)(1), unless the authorization is terminated or revoked.  Performed at [facility], [address] [place], [place] [address]   Culture, Urine (Do not remove urinary catheter, catheter placed by urology or difficult to place)     Status: Abnormal   Collection Time: 11/06/24  1:44 AM   Specimen: Urine, Catheterized  Result Value Ref Range Status    Specimen Description   Final    URINE, CATHETERIZED Performed at [facility], [address] [place], [place] [address]    Special Requests   Final    NONE Performed at [facility], [address] [place], [place] [address]    Culture MULTIPLE SPECIES PRESENT, SUGGEST RECOLLECTION (A)  Final   Report Status 11/08/2024 FINAL  Final    Labs: CBC: Recent Labs  Lab 11/05/24 2339 11/07/24 0434  WBC 5.4 6.4  NEUTROABS 2.8  --   HGB 11.9* 13.2  HCT 36.5* 39.9  MCV 88.4 88.3  PLT 354 359   Basic Metabolic Panel: Recent Labs  Lab 11/05/24 2339 11/07/24 0434  NA 133* 140  K 4.4 4.2  CL 96* 105  CO2 23 22  GLUCOSE 208* 60*  BUN 22* 15  CREATININE 1.70* 1.21  CALCIUM 10.0 9.3  MG  --  2.1   Liver Function Tests: Recent Labs  Lab 11/05/24 2339  AST 26  ALT 23  ALKPHOS 127*  BILITOT 0.4  PROT 7.5  ALBUMIN 4.0   CBG: Recent Labs  Lab 11/09/24 0729 11/09/24 1205 11/09/24 1550 11/09/24 1931 11/10/24 0849  GLUCAP 203* 185* 205* 295* 144*    Discharge time spent: greater than 30 minutes.  Signed: AIDA CHO, MD [facility] 11/10/2024
